# Patient Record
Sex: Female | Born: 1965 | Race: White | Hispanic: No | Marital: Married | Smoking: Current every day smoker
Health system: Southern US, Community
[De-identification: ages and names within clinical notes are randomized; demographics above are authoritative.]

## PROBLEM LIST (undated history)

## (undated) DIAGNOSIS — I10 Essential (primary) hypertension: Secondary | ICD-10-CM

## (undated) HISTORY — DX: Essential (primary) hypertension: I10

## (undated) HISTORY — PX: LAPAROSCOPIC CHOLECYSTECTOMY: SUR755

## (undated) HISTORY — PX: BREAST ENHANCEMENT SURGERY: SHX7

## (undated) HISTORY — PX: CHOLECYSTECTOMY: SHX55

## (undated) HISTORY — PX: MANDIBLE SURGERY: SHX707

## (undated) HISTORY — PX: HEMORRHOID SURGERY: SHX153

---

## 1998-09-25 ENCOUNTER — Other Ambulatory Visit: Admission: RE | Admit: 1998-09-25 | Discharge: 1998-09-25 | Payer: Self-pay | Admitting: Obstetrics & Gynecology

## 1999-11-11 ENCOUNTER — Other Ambulatory Visit: Admission: RE | Admit: 1999-11-11 | Discharge: 1999-11-11 | Payer: Self-pay | Admitting: Obstetrics & Gynecology

## 2000-04-04 ENCOUNTER — Ambulatory Visit (HOSPITAL_COMMUNITY): Admission: RE | Admit: 2000-04-04 | Discharge: 2000-04-04 | Payer: Self-pay | Admitting: Obstetrics & Gynecology

## 2001-01-19 ENCOUNTER — Other Ambulatory Visit: Admission: RE | Admit: 2001-01-19 | Discharge: 2001-01-19 | Payer: Self-pay | Admitting: Obstetrics & Gynecology

## 2002-02-08 ENCOUNTER — Other Ambulatory Visit: Admission: RE | Admit: 2002-02-08 | Discharge: 2002-02-08 | Payer: Self-pay | Admitting: Obstetrics & Gynecology

## 2003-02-28 ENCOUNTER — Other Ambulatory Visit: Admission: RE | Admit: 2003-02-28 | Discharge: 2003-02-28 | Payer: Self-pay | Admitting: Obstetrics & Gynecology

## 2003-09-03 ENCOUNTER — Other Ambulatory Visit: Admission: RE | Admit: 2003-09-03 | Discharge: 2003-09-03 | Payer: Self-pay | Admitting: Obstetrics & Gynecology

## 2004-02-19 ENCOUNTER — Encounter (INDEPENDENT_AMBULATORY_CARE_PROVIDER_SITE_OTHER): Payer: Self-pay | Admitting: Specialist

## 2004-02-19 ENCOUNTER — Ambulatory Visit (HOSPITAL_COMMUNITY): Admission: RE | Admit: 2004-02-19 | Discharge: 2004-02-19 | Payer: Self-pay | Admitting: General Surgery

## 2004-04-16 ENCOUNTER — Other Ambulatory Visit: Admission: RE | Admit: 2004-04-16 | Discharge: 2004-04-16 | Payer: Self-pay | Admitting: Obstetrics & Gynecology

## 2004-12-16 ENCOUNTER — Other Ambulatory Visit: Admission: RE | Admit: 2004-12-16 | Discharge: 2004-12-16 | Payer: Self-pay | Admitting: Obstetrics & Gynecology

## 2005-06-15 ENCOUNTER — Other Ambulatory Visit: Admission: RE | Admit: 2005-06-15 | Discharge: 2005-06-15 | Payer: Self-pay | Admitting: Obstetrics & Gynecology

## 2005-09-15 ENCOUNTER — Ambulatory Visit: Payer: Self-pay | Admitting: Cardiology

## 2005-09-15 ENCOUNTER — Ambulatory Visit: Payer: Self-pay | Admitting: Family Medicine

## 2006-07-10 ENCOUNTER — Ambulatory Visit: Payer: Self-pay | Admitting: Family Medicine

## 2007-01-29 ENCOUNTER — Ambulatory Visit: Payer: Self-pay | Admitting: Family Medicine

## 2007-01-29 DIAGNOSIS — J40 Bronchitis, not specified as acute or chronic: Secondary | ICD-10-CM | POA: Insufficient documentation

## 2007-02-09 ENCOUNTER — Telehealth: Payer: Self-pay | Admitting: Family Medicine

## 2007-11-15 ENCOUNTER — Ambulatory Visit: Payer: Self-pay | Admitting: Internal Medicine

## 2007-11-15 DIAGNOSIS — J309 Allergic rhinitis, unspecified: Secondary | ICD-10-CM | POA: Insufficient documentation

## 2007-11-15 DIAGNOSIS — J019 Acute sinusitis, unspecified: Secondary | ICD-10-CM

## 2008-05-23 ENCOUNTER — Ambulatory Visit: Payer: Self-pay | Admitting: Family Medicine

## 2008-05-23 DIAGNOSIS — A6000 Herpesviral infection of urogenital system, unspecified: Secondary | ICD-10-CM | POA: Insufficient documentation

## 2008-05-23 DIAGNOSIS — I10 Essential (primary) hypertension: Secondary | ICD-10-CM | POA: Insufficient documentation

## 2008-05-26 ENCOUNTER — Telehealth: Payer: Self-pay | Admitting: Family Medicine

## 2008-05-27 ENCOUNTER — Telehealth (INDEPENDENT_AMBULATORY_CARE_PROVIDER_SITE_OTHER): Payer: Self-pay | Admitting: *Deleted

## 2008-05-30 ENCOUNTER — Telehealth (INDEPENDENT_AMBULATORY_CARE_PROVIDER_SITE_OTHER): Payer: Self-pay | Admitting: *Deleted

## 2008-08-01 ENCOUNTER — Encounter: Admission: RE | Admit: 2008-08-01 | Discharge: 2008-08-01 | Payer: Self-pay | Admitting: Obstetrics & Gynecology

## 2008-10-13 ENCOUNTER — Ambulatory Visit: Payer: Self-pay | Admitting: Family Medicine

## 2008-10-13 LAB — CONVERTED CEMR LAB: Rapid Strep: NEGATIVE

## 2008-10-21 ENCOUNTER — Telehealth (INDEPENDENT_AMBULATORY_CARE_PROVIDER_SITE_OTHER): Payer: Self-pay | Admitting: *Deleted

## 2008-10-24 ENCOUNTER — Encounter: Payer: Self-pay | Admitting: Family Medicine

## 2010-02-18 ENCOUNTER — Emergency Department (HOSPITAL_COMMUNITY): Admission: EM | Admit: 2010-02-18 | Discharge: 2010-02-18 | Payer: Self-pay | Admitting: Family Medicine

## 2010-04-02 ENCOUNTER — Ambulatory Visit (HOSPITAL_COMMUNITY): Admission: RE | Admit: 2010-04-02 | Discharge: 2010-04-02 | Payer: Self-pay | Admitting: Oral and Maxillofacial Surgery

## 2010-08-06 ENCOUNTER — Other Ambulatory Visit: Payer: Self-pay | Admitting: Obstetrics & Gynecology

## 2010-11-05 NOTE — Op Note (Signed)
NAME:  Kimberly Carter, Kimberly Carter                             ACCOUNT NO.:  [id number]   MEDICAL RECORD NO.:  [id number]                   PATIENT TYPE:  AMB   LOCATION:  DAY                                  FACILITY:  [facility]   PHYSICIAN:  Timothy E. Earlene Plater, M.D.              DATE OF BIRTH:  [birth year]   DATE OF PROCEDURE:  02/19/2004  DATE OF DISCHARGE:                                 OPERATIVE REPORT   PREOPERATIVE DIAGNOSIS:  Internal and external hemorrhoids.   POSTOPERATIVE DIAGNOSIS:  Internal and external hemorrhoids with anal  polyps.   OPERATIVE PROCEDURE:  Examination under anesthesia, excision of anal polyps,  hemorrhoidectomy.   SURGEON:  Timothy E. Earlene Plater, M.D.   ANESTHESIA:  General.   [name] was in the office earlier this week and was quite desperate help  with her ballooning, prolapsing hemorrhoids that she has had off and on for  years, most severely in the last two weeks.  She is otherwise healthy, and  though she does not complain of her bowel actions, she is constipated,  frequently going many days without a stool.  She has been carefully  counseled in regards to the pathology, the GI function, and bowel habits,  the hemorrhoidal surgery, the expectations and possible complications,  including sphincter injury, anal stenosis, bleeding, infection, and  recurrent hemorrhoids, and she still agrees.  She was identified and the  permit signed.   Patient was taken to the operating room and placed supine.  LMA anesthesia  provided.  She was placed in lithotomy.  The perianal area was inspected,  prepped and draped in the usual fashion.  Soft, fragile, ballooning  hemorrhoids were present left lateral and right posterior.  As well, there  was a polyp on top of the left lateral hemorrhoid and a second polyp in the  posterior dentate line.  The anterior anorectum was normal.  The area was  injected round about with Marcaine, epinephrine, and Wydase and massaged in  well.  The  perianal area was handled delicately, as her skin was thin and  the blood vessels fragile.  Appropriate incisions were made, both left  lateral and right posterior.  Careful undermining of the skin revealed all  of the underlying varicosities, which were removed.  Each wound was closed  carefully with a running 3-0 chromic.  The anal polyp posteriorly was then  simply excised, and its base over-sewn with a figure-of-eight suture of 3-0  chromic.  She tolerated this well.  There was no bleeding or complication.  The anal orifice was adequate.  The sphincter was not harmed.  Gelfoam gauze  and a dry sterile dressing applied.  Counts were correct.  She was awakened  and taken to the recovery room in good condition.   Written and verbal instructions were given, including Demerol 50 mg #36, and  she will be seen and followed as an  outpatient.                                               Timothy E. Earlene Plater, M.D.    TED/MEDQ  D:  02/19/2004  T:  02/19/2004  Job:  [id number]   cc:   Angelena Sole, M.D. [facility]

## 2010-11-05 NOTE — Procedures (Signed)
[facility]  Patient:    EVERLYN, FARABAUGH                      MRN: [id number] Proc. Date: 04/04/00 Adm. Date:  [id number] Attending:  Judeth Cornfield CC:         Angelena Sole, M.D. [facility]   Procedure Report  PROCEDURE:  Sigmoidoscopy.  HISTORY:  Mrs. Brooke Dare has had recurrent symptomatic hemorrhoids.  Test is performed for evaluation and band ligation.  INFORMED CONSENT:  The patient provided consent after risks, benefits, and alternatives were explained.  MEDICATIONS:  Versed 2 mg IV, Fentanyl 50 mcg IV.  DESCRIPTION OF PROCEDURE:  The patient was placed in the left lateral decubitus position, administered continuous low-flow oxygen, and was placed on pulse oximetry.  Digital inspection of the rectum demonstrated a grade 3, partially thrombosed hemorrhoid.  The Olympus video colonoscope was inserted to 30 cm corresponding to the sigmoid.  The scope was then withdrawn, and the distal colon was examined. The scope was also retroflexed in the rectal vault.  FINDINGS:  There was a partially thrombosed hemorrhoid as previously described.  There were multiple large internal hemorrhoids as well.  IMPRESSION:  Internal hemorrhoids and partially thrombosed hemorrhoid.  RECOMMENDATIONS: 1. Because of the acuteness of the problems including the partially thrombosed    hemorrhoid, I decided not to pursue band ligation. 2. Warm soaks, Anusol-HC suppositories. 3. Surgical evaluation. DD:  04/04/00 TD:  04/04/00 Job: [id number]

## 2010-11-22 ENCOUNTER — Ambulatory Visit (INDEPENDENT_AMBULATORY_CARE_PROVIDER_SITE_OTHER): Payer: PRIVATE HEALTH INSURANCE | Admitting: Family Medicine

## 2010-11-22 ENCOUNTER — Encounter: Payer: Self-pay | Admitting: Family Medicine

## 2010-11-22 VITALS — BP 120/74 | Temp 99.1°F | Wt 152.0 lb

## 2010-11-22 DIAGNOSIS — J029 Acute pharyngitis, unspecified: Secondary | ICD-10-CM

## 2010-11-22 DIAGNOSIS — I889 Nonspecific lymphadenitis, unspecified: Secondary | ICD-10-CM

## 2010-11-22 LAB — POCT RAPID STREP A (OFFICE): Rapid Strep A Screen: NEGATIVE

## 2010-11-22 MED ORDER — CLARITHROMYCIN ER 500 MG PO TB24: 1000.0000 mg | ORAL_TABLET | Freq: Every day | ORAL | Status: AC

## 2010-11-22 NOTE — Patient Instructions (Signed)
Strep test is negative- this is good news Take the Biaxin as directed for possible infected lymph node Continue tylenol/ibuprofen as needed for pain Drink plenty of fluids Call with any questions or concerns Hang in there!

## 2010-11-22 NOTE — Assessment & Plan Note (Signed)
Rapid strep was negative.  Most likely viral.  Tylenol/ibuprofen as needed for pain.

## 2010-11-22 NOTE — Progress Notes (Signed)
  Subjective:    Patient ID: Kimberly Carter, female    DOB: 1966-02-26, 45 y.o.   MRN: [id number]  HPI L ear pain- sxs started 10 days ago w/ sore throat.  Pain improves w/ ibuprofen.  Denies facial pain/pressure.  No fever.  No known sick contacts.  + PND w/ cough.  + TTP over L side of throat.   Review of Systems For ROS see HPI     Objective:   Physical Exam  Constitutional: She appears well-developed and well-nourished. No distress.  HENT:  Head: Normocephalic and atraumatic.  Nose: Nose normal.  Mouth/Throat: No oropharyngeal exudate.       No TTP over sinuses TMs normal bilaterally Throat w/out erythema or tonsilar enlargement/exudates + PND  Neck: Normal range of motion. Neck supple.  Cardiovascular: Normal rate, regular rhythm, normal heart sounds and intact distal pulses.   Pulmonary/Chest: Effort normal and breath sounds normal. No respiratory distress. She has no wheezes.  Lymphadenopathy:    She has cervical adenopathy (tender mid-anterior L cervical chain LAD (~1.5 cm)).          Assessment & Plan:

## 2010-11-22 NOTE — Assessment & Plan Note (Signed)
Pt w/ tender, likely reactive L ant chain cervical LAD.  Start biaxin to tx lymphadenitis as pt is allergic to augmentin.  Reviewed supportive care and red flags that should prompt return.  Pt expressed understanding and is in agreement w/ plan.

## 2010-11-29 ENCOUNTER — Telehealth: Payer: Self-pay | Admitting: *Deleted

## 2010-11-29 ENCOUNTER — Ambulatory Visit (INDEPENDENT_AMBULATORY_CARE_PROVIDER_SITE_OTHER): Payer: PRIVATE HEALTH INSURANCE | Admitting: Family Medicine

## 2010-11-29 ENCOUNTER — Encounter: Payer: Self-pay | Admitting: Family Medicine

## 2010-11-29 VITALS — BP 114/74 | HR 64 | Temp 98.9°F | Wt 153.0 lb

## 2010-11-29 DIAGNOSIS — J029 Acute pharyngitis, unspecified: Secondary | ICD-10-CM

## 2010-11-29 NOTE — Progress Notes (Signed)
  Subjective:     Kimberly Carter is a 45 y.o. female who presents for evaluation of sore throat. Associated symptoms include nasal blockage, post nasal drip, sinus and nasal congestion and sore throat. Onset of symptoms was 10 days ago, and have been unchanged since that time. She is drinking plenty of fluids. She has not had a recent close exposure to someone with proven streptococcal pharyngitis.  The following portions of the patient's history were reviewed and updated as appropriate: allergies, current medications, past family history, past medical history, past social history, past surgical history and problem list.  Review of Systems Pertinent items are noted in HPI.    Objective:    BP 114/74  Pulse 64  Temp(Src) 98.9 F (37.2 C) (Oral)  Wt 153 lb (69.4 kg)  SpO2 96% General appearance: alert, cooperative, appears stated age and no distress Ears: normal TM's and external ear canals both ears Nose: Nares normal. Septum midline. Mucosa normal. No drainage or sinus tenderness., clear discharge, mild congestion Throat: abnormal findings: mild oropharyngeal erythema Neck: no adenopathy, no carotid bruit, no JVD, supple, symmetrical, trachea midline and thyroid not enlarged, symmetric, no tenderness/mass/nodules Lungs: clear to auscultation bilaterally Heart: regular rate and rhythm, S1, S2 normal, no murmur, click, rub or gallop  Laboratory Strep test done. Results:negative.    Assessment:    Acute pharyngitis, likely  Viral pharyngitis.    Plan:    Use of OTC analgesics recommended as well as salt water gargles. Follow up as needed. antihistamine and veramyst, astepro

## 2010-11-29 NOTE — Patient Instructions (Signed)
Take otc antihistamine---ex  claritin , zyrtec or allegra daily Use veramyst and astepro---they are both 2 sprays each nostril 1x a day We should have the culture back in 2-3 days

## 2010-11-29 NOTE — Telephone Encounter (Signed)
Pt was given 10 days of biaxin- if she is not feeling any better will need to return for additional evaluation

## 2010-11-29 NOTE — Telephone Encounter (Signed)
Pt called noting that she was seen seven days ago and is not any better. Pt would like to know if there is something else she can try or should she come back in. Please advise.

## 2010-11-29 NOTE — Telephone Encounter (Signed)
Pt.notified

## 2010-11-30 NOTE — Progress Notes (Signed)
Addended by: Legrand Como on: 11/30/2010 11:26 AM   Modules accepted: Orders

## 2010-12-03 NOTE — Progress Notes (Signed)
Addended by: Legrand Como on: 12/03/2010 08:08 AM   Modules accepted: Orders

## 2010-12-06 ENCOUNTER — Other Ambulatory Visit: Payer: Self-pay | Admitting: Family Medicine

## 2010-12-06 ENCOUNTER — Other Ambulatory Visit: Payer: PRIVATE HEALTH INSURANCE

## 2010-12-07 ENCOUNTER — Other Ambulatory Visit: Payer: Self-pay | Admitting: Family Medicine

## 2010-12-07 ENCOUNTER — Other Ambulatory Visit: Payer: PRIVATE HEALTH INSURANCE

## 2010-12-07 LAB — CBC WITH DIFFERENTIAL/PLATELET
Basophils Absolute: 0 10*3/uL (ref 0.0–0.1)
Eosinophils Absolute: 0.6 10*3/uL (ref 0.0–0.7)
Eosinophils Relative: 9 % — ABNORMAL HIGH (ref 0–5)
HCT: 39.9 % (ref 36.0–46.0)
MCH: 35.5 pg — ABNORMAL HIGH (ref 26.0–34.0)
MCV: 105.8 fL — ABNORMAL HIGH (ref 78.0–100.0)
Monocytes Absolute: 0.4 10*3/uL (ref 0.1–1.0)
Platelets: 252 10*3/uL (ref 150–400)
RDW: 13.9 % (ref 11.5–15.5)

## 2010-12-07 NOTE — Progress Notes (Signed)
Addended by: Legrand Como on: 12/07/2010 05:01 PM   Modules accepted: Orders

## 2010-12-07 NOTE — Progress Notes (Signed)
Labs only

## 2010-12-08 ENCOUNTER — Encounter: Payer: Self-pay | Admitting: *Deleted

## 2010-12-08 LAB — EPSTEIN-BARR VIRUS VCA ANTIBODY PANEL
EBV EA IgG: 4.15 {ISR} — ABNORMAL HIGH
EBV NA IgG: 2.51 {ISR} — ABNORMAL HIGH
EBV VCA IgG: 3.71 {ISR} — ABNORMAL HIGH
EBV VCA IgM: 0.37 {ISR}

## 2010-12-09 ENCOUNTER — Telehealth: Payer: Self-pay | Admitting: *Deleted

## 2010-12-09 NOTE — Telephone Encounter (Signed)
Pt states that she is currently taking 2 nasal spray to help with her allergy. Pt notes that she continues to  Have a sore throat from drainage but she has not started antihistamine as of yet. Pt indicated that she is going to start antihistamine along with nasal spray to see if this will help and if not will call back for further suggestions.

## 2010-12-09 NOTE — Telephone Encounter (Signed)
Message copied by Verdene Rio on Thu Dec 09, 2010  4:34 PM ------      Message from: Lelon Perla      Created: Wed Dec 08, 2010  8:54 AM       CBC normal except eosin---prob from allergies----recheck 3 months  cbcd  288.8

## 2010-12-11 LAB — RSV(RESPIRATORY SYNCYTIAL VIRUS) AB, BLOOD

## 2011-03-02 ENCOUNTER — Ambulatory Visit (HOSPITAL_BASED_OUTPATIENT_CLINIC_OR_DEPARTMENT_OTHER)
Admission: RE | Admit: 2011-03-02 | Discharge: 2011-03-02 | Disposition: A | Discharge status: Home / Self Care | Payer: PRIVATE HEALTH INSURANCE | Source: Ambulatory Visit | Attending: Family | Admitting: Family

## 2011-03-02 ENCOUNTER — Telehealth: Payer: Self-pay | Admitting: Family

## 2011-03-02 ENCOUNTER — Encounter: Payer: Self-pay | Admitting: Family

## 2011-03-02 ENCOUNTER — Ambulatory Visit: Payer: PRIVATE HEALTH INSURANCE | Admitting: Family Medicine

## 2011-03-02 ENCOUNTER — Ambulatory Visit (INDEPENDENT_AMBULATORY_CARE_PROVIDER_SITE_OTHER): Payer: PRIVATE HEALTH INSURANCE | Admitting: Family

## 2011-03-02 VITALS — BP 98/70 | HR 84 | Temp 97.8°F | Resp 16 | Wt 149.0 lb

## 2011-03-02 DIAGNOSIS — R109 Unspecified abdominal pain: Secondary | ICD-10-CM | POA: Insufficient documentation

## 2011-03-02 DIAGNOSIS — K7689 Other specified diseases of liver: Secondary | ICD-10-CM

## 2011-03-02 DIAGNOSIS — K769 Liver disease, unspecified: Secondary | ICD-10-CM

## 2011-03-02 DIAGNOSIS — M545 Low back pain, unspecified: Secondary | ICD-10-CM | POA: Insufficient documentation

## 2011-03-02 DIAGNOSIS — N200 Calculus of kidney: Secondary | ICD-10-CM | POA: Insufficient documentation

## 2011-03-02 DIAGNOSIS — Z975 Presence of (intrauterine) contraceptive device: Secondary | ICD-10-CM | POA: Insufficient documentation

## 2011-03-02 DIAGNOSIS — R3129 Other microscopic hematuria: Secondary | ICD-10-CM

## 2011-03-02 DIAGNOSIS — N390 Urinary tract infection, site not specified: Secondary | ICD-10-CM

## 2011-03-02 LAB — POCT URINALYSIS DIPSTICK
Bilirubin, UA: NEGATIVE
Glucose, UA: NEGATIVE
Ketones, UA: NEGATIVE
Leukocytes, UA: NEGATIVE
Nitrite, UA: NEGATIVE
pH, UA: 8

## 2011-03-02 MED ORDER — CIPROFLOXACIN HCL 500 MG PO TABS: 500.0000 mg | ORAL_TABLET | Freq: Two times a day (BID) | ORAL | Status: AC

## 2011-03-02 MED ORDER — MELOXICAM 7.5 MG PO TABS: ORAL_TABLET | ORAL | Status: DC

## 2011-03-02 NOTE — Telephone Encounter (Signed)
Notified [employer] at [employer].

## 2011-03-02 NOTE — Telephone Encounter (Signed)
Spoke with pt.  Reviewed, CT results, plan for mri and PRN mobic. Pt verbalizes understanding.

## 2011-03-02 NOTE — Telephone Encounter (Signed)
Please advise how often pt should take Meloxicam?

## 2011-03-02 NOTE — Progress Notes (Signed)
  Subjective:    Patient ID: Kimberly Carter, female    DOB: 06-18-1966, 45 y.o.   MRN: [id number]  HPI  Ms.  Carter is a 45 yr old patient who presents today with chief complaint of left lower back pain.  Symptoms started on Monday 9/10. She also notes associated suprapubic pain.  Denies associated urinary frequency, fever, dysuria, or hematuria.  Denies previous hx of kidney stones or known back injury.   Review of Systems See HPI  Past Medical History  Diagnosis Date  . Hypertension   . Genital herpes   . Allergic rhinitis     History   Social History  . Marital Status: Married    Spouse Name: N/A    Number of Children: N/A  . Years of Education: N/A   Occupational History  . Not on file.   Social History Main Topics  . Smoking status: Current Everyday Smoker  . Smokeless tobacco: Not on file   Comment: passive smoker  . Alcohol Use: Yes     occ.  . Drug Use: No  . Sexually Active:    Other Topics Concern  . Not on file   Social History Narrative  . No narrative on file    No past surgical history on file.  No family history on file.  Allergies  Allergen Reactions  . Codeine   . NWG:NFAOZHYQMVH+QIONGEXBM+WUXLKGMWNU Acid+Aspartame     REACTION: diarrhea    Current Outpatient Prescriptions on File Prior to Visit  Medication Sig Dispense Refill  . atenolol (TENORMIN) 100 MG tablet Take 100 mg by mouth daily.        . sertraline (ZOLOFT) 25 MG tablet Take 25 mg by mouth as directed. One half tab qhs       . valACYclovir (VALTREX) 1000 MG tablet Take 500 mg by mouth daily.          BP 98/70  Pulse 84  Temp(Src) 97.8 F (36.6 C) (Oral)  Resp 16  Wt 149 lb (67.586 kg)       Objective:   Physical Exam  Constitutional: She appears well-developed and well-nourished. No distress.  Cardiovascular: Normal rate and regular rhythm.   No murmur heard. Pulmonary/Chest: Effort normal and breath sounds normal. No respiratory distress. She has no wheezes. She  has no rales.  Abdominal: Soft. Bowel sounds are normal. There is no hepatosplenomegaly.       Mild suprapubic tenderness to palpation without guarding.    Genitourinary:       Neg CVAT bilaterally          Assessment & Plan:

## 2011-03-02 NOTE — Patient Instructions (Signed)
Please go downstairs this AM to complete your CT scan. We will contact you with the results. Start Cipro today. Call if you see bright red blood in urine, if you develop fever >101, nausea, worsening low back pain- or if you are not feeling better in 2-3 days.

## 2011-03-02 NOTE — Telephone Encounter (Signed)
Per [facility], they need directions for meloxicam 7.5mg  tablet

## 2011-03-02 NOTE — Telephone Encounter (Signed)
Clarification- once daily PRN pain, thanks

## 2011-03-03 ENCOUNTER — Telehealth: Payer: Self-pay | Admitting: *Deleted

## 2011-03-03 DIAGNOSIS — K769 Liver disease, unspecified: Secondary | ICD-10-CM | POA: Insufficient documentation

## 2011-03-03 DIAGNOSIS — N390 Urinary tract infection, site not specified: Secondary | ICD-10-CM | POA: Insufficient documentation

## 2011-03-03 DIAGNOSIS — Z0189 Encounter for other specified special examinations: Secondary | ICD-10-CM

## 2011-03-03 LAB — CREATININE, SERUM: Creat: 0.86 mg/dL (ref 0.50–1.10)

## 2011-03-03 LAB — BUN: BUN: 12 mg/dL (ref 6–23)

## 2011-03-03 NOTE — Assessment & Plan Note (Signed)
Incidental finding on CT of 3.6 x 3.3 cm hypodense lesion in the posterior segment right hepatic lobe. Three additional tiny subcentimeter hypoenhancing hepatic lesions are suspected.  Will refer for MRI per radiology recommendations for further evaluation.

## 2011-03-03 NOTE — Assessment & Plan Note (Signed)
UA + for blood.  Will plan to send for culture and treat empirically with cipro.  CT of the abdomen was performed to rule out stone in the setting of back pain and microscopic hematuria.  Note was made of small non-obstructing stones on contralateral side to pt's pain.  Pain likely MS in origin.

## 2011-03-03 NOTE — Telephone Encounter (Signed)
Pt will return for MRI with contrast and pt needs BUN/Creat prior to receiving contrast dye. Pt will return to the lab today for tests. Order has been entered and faxed to the lab.

## 2011-03-04 ENCOUNTER — Telehealth: Payer: Self-pay | Admitting: Family

## 2011-03-04 LAB — URINE CULTURE: Organism ID, Bacteria: NO GROWTH

## 2011-03-04 NOTE — Telephone Encounter (Signed)
Patient returned call. She stated that her IUD is a [employer] fast streamer. Best# to call [phone]

## 2011-03-04 NOTE — Telephone Encounter (Signed)
Confirmed with pt.

## 2011-03-04 NOTE — Telephone Encounter (Signed)
Left message for pt to return our call. When she calls back pls ask her if her IUD is a Conservation officer, nature (copper). I need to know this prior to her MRI tomorrow.

## 2011-03-05 ENCOUNTER — Ambulatory Visit (HOSPITAL_BASED_OUTPATIENT_CLINIC_OR_DEPARTMENT_OTHER)
Admission: RE | Admit: 2011-03-05 | Discharge: 2011-03-05 | Disposition: A | Discharge status: Home / Self Care | Payer: PRIVATE HEALTH INSURANCE | Source: Ambulatory Visit | Attending: Family | Admitting: Family

## 2011-03-05 DIAGNOSIS — K769 Liver disease, unspecified: Secondary | ICD-10-CM

## 2011-03-05 DIAGNOSIS — Q619 Cystic kidney disease, unspecified: Secondary | ICD-10-CM

## 2011-03-05 DIAGNOSIS — K7689 Other specified diseases of liver: Secondary | ICD-10-CM

## 2011-03-05 MED ORDER — GADOBENATE DIMEGLUMINE 529 MG/ML IV SOLN
15.0000 mL | Freq: Once | INTRAVENOUS | Status: AC | PRN
  Administered 2011-03-05: 15 mL via INTRAVENOUS

## 2011-03-07 ENCOUNTER — Telehealth: Payer: Self-pay | Admitting: Family

## 2011-03-07 NOTE — Telephone Encounter (Signed)
Left message for pt to return my call to discuss MRI.

## 2011-03-07 NOTE — Telephone Encounter (Signed)
Spoke with pt, reviewed results and plan to repeat her MRI in 3-6 months.  Pt verbalizes understanding.

## 2011-06-20 ENCOUNTER — Telehealth: Payer: Self-pay | Admitting: Family

## 2011-06-20 DIAGNOSIS — K769 Liver disease, unspecified: Secondary | ICD-10-CM

## 2011-06-26 NOTE — Telephone Encounter (Signed)
Kimberly Carter- Please call pt and let her know that I have ordered her follow up MRI to re-evaluate the spot on her liver that was seen on CT in September.  She should have a BMET drawn 2 days before MRI please.  That can be drawn at [facility].  MRI order has been pended.

## 2011-06-27 NOTE — Telephone Encounter (Signed)
Left message on machine to return my call. 

## 2011-06-28 NOTE — Telephone Encounter (Signed)
Left message on machine to return my call. 

## 2011-06-30 NOTE — Telephone Encounter (Signed)
Left message on cell voicemail to return my call 

## 2011-07-01 NOTE — Telephone Encounter (Signed)
Attempted to reach and pt and received voicemail. Mailed contact letter as pt has not returned my previous calls.

## 2011-07-05 LAB — BASIC METABOLIC PANEL
BUN: 16 mg/dL (ref 6–23)
Calcium: 8.9 mg/dL (ref 8.4–10.5)
Chloride: 101 mEq/L (ref 96–112)
Creat: 0.72 mg/dL (ref 0.50–1.10)

## 2011-07-05 NOTE — Telephone Encounter (Signed)
Addended by: Mervin Kung A on: 07/05/2011 01:45 PM   Modules accepted: Orders

## 2011-07-05 NOTE — Telephone Encounter (Signed)
Kimberly Carter spoke with pt, she will come to our office today for lab work. Order entered and forwarded to the lab.

## 2011-07-09 ENCOUNTER — Other Ambulatory Visit (HOSPITAL_BASED_OUTPATIENT_CLINIC_OR_DEPARTMENT_OTHER): Payer: PRIVATE HEALTH INSURANCE

## 2011-07-09 ENCOUNTER — Ambulatory Visit (HOSPITAL_BASED_OUTPATIENT_CLINIC_OR_DEPARTMENT_OTHER)
Admission: RE | Admit: 2011-07-09 | Discharge: 2011-07-09 | Disposition: A | Discharge status: Home / Self Care | Payer: 59 | Source: Ambulatory Visit | Attending: Family | Admitting: Family

## 2011-07-09 DIAGNOSIS — K769 Liver disease, unspecified: Secondary | ICD-10-CM

## 2011-07-09 DIAGNOSIS — N289 Disorder of kidney and ureter, unspecified: Secondary | ICD-10-CM | POA: Insufficient documentation

## 2011-07-09 DIAGNOSIS — Z0389 Encounter for observation for other suspected diseases and conditions ruled out: Secondary | ICD-10-CM

## 2011-07-09 DIAGNOSIS — K7689 Other specified diseases of liver: Secondary | ICD-10-CM | POA: Insufficient documentation

## 2011-07-09 MED ORDER — GADOBENATE DIMEGLUMINE 529 MG/ML IV SOLN
10.0000 mL | Freq: Once | INTRAVENOUS | Status: AC | PRN
  Administered 2011-07-09: 10 mL via INTRAVENOUS

## 2011-07-11 ENCOUNTER — Telehealth: Payer: Self-pay | Admitting: Family

## 2011-07-11 NOTE — Telephone Encounter (Signed)
Reviewed MRI report with pt.  Reassured her that changes appear benign.

## 2011-09-28 ENCOUNTER — Other Ambulatory Visit: Payer: Self-pay | Admitting: Obstetrics & Gynecology

## 2011-09-28 DIAGNOSIS — R928 Other abnormal and inconclusive findings on diagnostic imaging of breast: Secondary | ICD-10-CM

## 2011-10-04 ENCOUNTER — Ambulatory Visit
Admission: RE | Admit: 2011-10-04 | Discharge: 2011-10-04 | Disposition: A | Discharge status: Home / Self Care | Payer: 59 | Source: Ambulatory Visit | Attending: Obstetrics & Gynecology | Admitting: Obstetrics & Gynecology

## 2011-10-04 DIAGNOSIS — R928 Other abnormal and inconclusive findings on diagnostic imaging of breast: Secondary | ICD-10-CM

## 2012-01-17 ENCOUNTER — Encounter (HOSPITAL_COMMUNITY): Payer: Self-pay | Admitting: Emergency Medicine

## 2012-01-17 ENCOUNTER — Emergency Department (HOSPITAL_COMMUNITY): Payer: 59

## 2012-01-17 ENCOUNTER — Emergency Department (HOSPITAL_COMMUNITY)
Admission: EM | Admit: 2012-01-17 | Discharge: 2012-01-17 | Disposition: A | Discharge status: Home / Self Care | Payer: 59 | Attending: Emergency Medicine | Admitting: Emergency Medicine

## 2012-01-17 DIAGNOSIS — I10 Essential (primary) hypertension: Secondary | ICD-10-CM | POA: Insufficient documentation

## 2012-01-17 DIAGNOSIS — R0602 Shortness of breath: Secondary | ICD-10-CM | POA: Insufficient documentation

## 2012-01-17 DIAGNOSIS — M549 Dorsalgia, unspecified: Secondary | ICD-10-CM

## 2012-01-17 DIAGNOSIS — Z79899 Other long term (current) drug therapy: Secondary | ICD-10-CM | POA: Insufficient documentation

## 2012-01-17 DIAGNOSIS — F17200 Nicotine dependence, unspecified, uncomplicated: Secondary | ICD-10-CM | POA: Insufficient documentation

## 2012-01-17 DIAGNOSIS — M48061 Spinal stenosis, lumbar region without neurogenic claudication: Secondary | ICD-10-CM | POA: Insufficient documentation

## 2012-01-17 LAB — CBC WITH DIFFERENTIAL/PLATELET
Basophils Absolute: 0 10*3/uL (ref 0.0–0.1)
Basophils Relative: 0 % (ref 0–1)
Eosinophils Absolute: 0 10*3/uL (ref 0.0–0.7)
Eosinophils Relative: 0 % (ref 0–5)
HCT: 36.7 % (ref 36.0–46.0)
MCH: 35.1 pg — ABNORMAL HIGH (ref 26.0–34.0)
MCHC: 34.3 g/dL (ref 30.0–36.0)
MCV: 102.2 fL — ABNORMAL HIGH (ref 78.0–100.0)
Monocytes Absolute: 0.6 10*3/uL (ref 0.1–1.0)
RDW: 13.1 % (ref 11.5–15.5)

## 2012-01-17 LAB — URINALYSIS, ROUTINE W REFLEX MICROSCOPIC
Glucose, UA: NEGATIVE mg/dL
Ketones, ur: NEGATIVE mg/dL
Leukocytes, UA: NEGATIVE
Protein, ur: NEGATIVE mg/dL

## 2012-01-17 LAB — BASIC METABOLIC PANEL
Calcium: 8.4 mg/dL (ref 8.4–10.5)
Creatinine, Ser: 0.67 mg/dL (ref 0.50–1.10)
GFR calc Af Amer: >90 mL/min (ref >90)

## 2012-01-17 LAB — LACTIC ACID, PLASMA: Lactic Acid, Venous: 1.3 mmol/L (ref 0.5–2.2)

## 2012-01-17 MED ORDER — ONDANSETRON HCL 4 MG/2ML IJ SOLN
4.0000 mg | INTRAMUSCULAR | Status: AC
  Administered 2012-01-17: 4 mg via INTRAVENOUS
  Filled 2012-01-17: qty 2

## 2012-01-17 MED ORDER — HYDROMORPHONE HCL PF 2 MG/ML IJ SOLN
2.0000 mg | Freq: Once | INTRAMUSCULAR | Status: AC
  Administered 2012-01-17: 2 mg via INTRAVENOUS
  Filled 2012-01-17: qty 1

## 2012-01-17 MED ORDER — ONDANSETRON 4 MG PO TBDP: 4.0000 mg | ORAL_TABLET | Freq: Three times a day (TID) | ORAL | Status: AC | PRN

## 2012-01-17 MED ORDER — MORPHINE SULFATE 4 MG/ML IJ SOLN
4.0000 mg | INTRAMUSCULAR | Status: DC | PRN
  Administered 2012-01-17: 4 mg via INTRAVENOUS
  Filled 2012-01-17 (×2): qty 1

## 2012-01-17 MED ORDER — HYDROCODONE-ACETAMINOPHEN 5-500 MG PO TABS: 1.0000 | ORAL_TABLET | Freq: Four times a day (QID) | ORAL | Status: AC | PRN

## 2012-01-17 MED ORDER — SENNOSIDES-DOCUSATE SODIUM 8.6-50 MG PO TABS: 1.0000 | ORAL_TABLET | Freq: Every day | ORAL | Status: AC

## 2012-01-17 MED ORDER — MORPHINE SULFATE 4 MG/ML IJ SOLN
4.0000 mg | Freq: Once | INTRAMUSCULAR | Status: AC
  Administered 2012-01-17: 4 mg via INTRAVENOUS
  Filled 2012-01-17: qty 1

## 2012-01-17 MED ORDER — GADOBENATE DIMEGLUMINE 529 MG/ML IV SOLN
13.0000 mL | Freq: Once | INTRAVENOUS | Status: AC | PRN
  Administered 2012-01-17: 13 mL via INTRAVENOUS

## 2012-01-17 NOTE — ED Notes (Signed)
Patient transported to MRI 

## 2012-01-17 NOTE — ED Notes (Signed)
Pt presents with c/o low back pain   Pt states she has problems with her back and has been seeing an orthopeadic dr  Pt states she went to him yesterday and was given 2 different medications that are not helping  Pt states she is scheduled for an MRI on Friday at 715  Pt states the pain feels different today causing her difficulty walking

## 2012-01-17 NOTE — ED Notes (Signed)
Bear Hugger applied due to low temp, PA aware.

## 2012-01-17 NOTE — ED Notes (Signed)
Patient transported to X-ray 

## 2012-01-17 NOTE — ED Provider Notes (Signed)
History     CSN: [id number]  Arrival date & time 01/17/12  0708   First MD Initiated Contact with Patient 01/17/12 [phone]      Chief Complaint  Patient presents with  . Back Pain    (Consider location/radiation/quality/duration/timing/severity/associated sxs/prior treatment) HPI  46 y/o female INAD excaerbation of chronic low back pain x2 days. Denies fever, change in bowel/or bladder habits, or parsathesia/numbness, h/o cancer or IVDU. Pt had her granddaughter over for the weekend and was very active and lifting her. Pt saw her orthopedist  Dr. Yevette Edwards yesterday, was given methocarbamol and meloxicam, which are not helping with pain. 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 Pt reports difficulty ambulating secondary to pain. Pain is positional 8/10 while staying still and 10/10 and while moving or weight bearing. Pt has MRI scheduled for Friday and would like that expedited today.   Past Medical History  Diagnosis Date  . Hypertension   . Genital herpes   . Allergic rhinitis     Past Surgical History  Procedure Date  . Mandible surgery   . Breast enhancement surgery     Family History  Problem Relation Age of Onset  . Hypertension Other   . Diabetes Other     History  Substance Use Topics  . Smoking status: Current Everyday Smoker  . Smokeless tobacco: Not on file   Comment: passive smoker  . Alcohol Use: Yes     occ.    OB History    Grav Para Term Preterm Abortions TAB SAB Ect Mult Living                  Review of Systems  Constitutional: Negative for fever.  Musculoskeletal: Positive for back pain.  Neurological: Negative for weakness.  All other systems reviewed and are negative.    Allergies  Amoxicillin-pot clavulanate and Codeine  Home Medications   Current Outpatient Rx  Name Route Sig Dispense Refill  . MELOXICAM 15 MG PO TABS Oral Take 15 mg by mouth daily.    Marland Kitchen METHOCARBAMOL 500 MG PO TABS Oral Take 500 mg by  mouth as needed.    . ATENOLOL 100 MG PO TABS Oral Take 100 mg by mouth daily.      Marland Kitchen CETIRIZINE HCL 10 MG PO TABS Oral Take 10 mg by mouth daily.      . SERTRALINE HCL 25 MG PO TABS Oral Take 25 mg by mouth as directed. One half tab qhs     . TRIAMTERENE-HCTZ 37.5-25 MG PO TABS Oral Take 1 tablet by mouth daily as needed. fluid    . VALACYCLOVIR HCL 1 G PO TABS Oral Take 500 mg by mouth daily.        BP 110/68  Pulse 65  Temp 97.7 F (36.5 C) (Oral)  Resp 18  SpO2 100%  Physical Exam  Nursing note and vitals reviewed. Constitutional: She is oriented to person, place, and time. She appears well-developed and well-nourished. No distress.  HENT:  Head: Normocephalic and atraumatic.  Eyes: Conjunctivae and EOM are normal. Pupils are equal, round, and reactive to light.  Neck: Normal range of motion.  Cardiovascular: Normal rate.   Pulmonary/Chest: Effort normal and breath sounds normal.  Abdominal: Soft. Bowel sounds are normal.  Musculoskeletal: Normal range of motion. She exhibits no edema.       Strait leg raise negative bilaterally. Sensation intact to light touch to lower extremities and her nail area. Diffusely  tender to lumbar paraspinal muscles.  Neurological: She is alert and oriented to person, place, and time.       Cranial nerves III through XII intact. Strength 5 out of 5x4 extremities.  Psychiatric: She has a normal mood and affect.    ED Course  Procedures (including critical care time)  Labs Reviewed  URINALYSIS, ROUTINE W REFLEX MICROSCOPIC - Abnormal; Notable for the following:    APPearance CLOUDY (*)     All other components within normal limits  CBC WITH DIFFERENTIAL - Abnormal; Notable for the following:    RBC 3.59 (*)     MCV 102.2 (*)     MCH 35.1 (*)     All other components within normal limits  BASIC METABOLIC PANEL - Abnormal; Notable for the following:    Glucose, Bld 108 (*)     All other components within normal limits  LACTIC ACID, PLASMA    CULTURE, BLOOD (ROUTINE X 2)  CULTURE, BLOOD (ROUTINE X 2)   Dg Chest 2 View  01/17/2012  *RADIOLOGY REPORT*  Clinical Data: Shortness of breath and weakness.  Smoker. Controlled hypertension.  CHEST - 2 VIEW  Comparison: None.  Findings: Normal sized heart.  Clear lungs.  Bilateral breast prostheses.  Unremarkable bones.  IMPRESSION: No acute abnormality.  Original Report Authenticated By: Darrol Angel, M.D.   Mr Lumbar Spine W Wo Contrast  01/17/2012  **ADDENDUM** CREATED: 01/17/2012 14:48:59  3.  Negative for epidural abscess.  **END ADDENDUM** SIGNED BY: Andreas Newport, M.D.   01/17/2012  *RADIOLOGY REPORT*  Clinical Data: 46 year old woman with acute on chronic low back pain. Epidural abscess.  MRI LUMBAR SPINE WITHOUT AND WITH CONTRAST  Technique:  Multiplanar and multiecho pulse sequences of the lumbar spine were obtained without and with intravenous contrast.  Contrast: 13mL MULTIHANCE GADOBENATE DIMEGLUMINE 529 MG/ML IV SOLN  Comparison: CT abdomen 03/02/2011.  Findings: Five lumbar type vertebral bodies are identified. Vertebral body height is preserved.  Active red marrow is identified.  Spinal cord terminates posterior the L1 vertebra. Paraspinal soft tissues demonstrate an atrophic/dystrophic right kidney with cystic lesions which appears similar to the prior CT 03/02/2011.  There is a mild levoconvex curvature of the lumbar spine with the apex at L3. Small Schmorl's node is present in the inferior T12 endplate.  T11-T12 through L3-L4 levels are normal.  L4-L5:  There is a broad-based posterior disc bulge.  Mild central stenosis is present.  There is crowding both lateral recesses potentially affecting both descending L5 nerves.  Short pedicles are present.  The foramina appear adequately patent.  Mild facet hypertrophy.  L5-S1:  Severe disc degeneration and collapse.  Mild to moderate central stenosis is present.  There is a circumferential disc bulge with central disc protrusion.  There is  caudal extension of disc material to the right of the midline compatible with sequestered disc fragment extending 1 cm caudal to the superior S1 endplate. The sequestered fragment measures 6 mm AP, 9 mm transverse and about 1 cm cranial-caudal. There is bilateral lateral recess stenosis just below the level of the disc space associated with disc herniation potentially affecting both descending S1 nerves. The foramina appear adequately patent.  The prior CT demonstrates partial calcification of disc protrusion.  IMPRESSION: 1.  L5-S1 central disc protrusion with small sequestered fragment that appears distinct from the disc extending caudally in the right paracentral region.  Protrusion and disc fragment produce bilateral lateral recess stenosis potentially affecting both S1 nerves.  Mild  to moderate central stenosis. 2.  L4-L5 mild central stenosis with bilateral lateral recess stenosis potentially affecting both descending L5 nerves.  Original Report Authenticated By: Andreas Newport, M.D.     1. Back pain       MDM  A 46 year-old female with exacerbation of chronic low back pain. No red flags for back pain. Neurologically nonfocal full-strength. I will attempt to control pain with IV morphine a walk the patient to verify a coordinated gait after pain is controlled. Plan to DC with close followup by her orthopedist Dr. Yevette Edwards.   0900 Patient ambulated to the restroom with support and severe exacerbation of pain.   9:28 AM  Patient has received 2 doses of 4 mg of morphine with no significant impact on her level of pain. I will give her 2 mg of Dilaudid. Patient's blood pressure remained stable  Consults from orthopedic PA Georjean Mode appreciated: She recommends expediting MRI which they will follow up close outpatient followup.  Patient reports significant improvement of pain after first 2 mg of Dilaudid dose. Because of the patient's relatively unsteady gait MRI of lumbar spine is indicated  at this time.  12:00 PM patient's temperature found to be 95.8 F rectally. Will DC patient's IV fluids and initiate bear hugger. Patient mentating normally. Reports she does feel chilly.  Patient's temperature was rechecked rectally with another thermometer while I was in the room and found to be 95.3C. B-air Hugger initiated.  Will change MRI to IV contrast to r/o epidural abscess I will send 2 blood cultures, CBC, lactate. Urinalysis is within normal limits.   Joni Reining Goro Wenrick, PA-C 01/17/12 1220  Pt's temperature normalized dn remains consistently within normal limits. Pt's pain is controlled and MRI shows no acute findings. Walked patient who displayed coordinated gait much improved with pain control. I will d/c the patient with Vicodin, sennakot and zofran. I will instruct Patient to follow tomorrow with her orthopedist.  Shared visit with attending.  Pt verbalized understanding and agrees with care plan. Outpatient follow-up and return precautions given.     Wynetta Emery, PA-C 01/17/12 1601

## 2012-01-17 NOTE — ED Provider Notes (Signed)
Medical screening examination/treatment/procedure(s) were conducted as a shared visit with non-physician practitioner(s) and myself.  I personally evaluated the patient during the encounter  Doug Sou, MD 01/17/12 [phone]

## 2012-01-17 NOTE — ED Notes (Signed)
PA at bedside.

## 2012-01-17 NOTE — ED Provider Notes (Signed)
Complains of low back pain radiating to right proximal hip onset 3 days ago a pain worse with movement no incontinence no fever no recent injury/. On exam appears uncomfortable Glasgow Coma Score 15 moves all extremities  DTRs symmetric bilaterally at knee jerk ankle jerk.toes downward going bilaterally   Doug Sou, MD 01/17/12 0865

## 2012-01-17 NOTE — ED Notes (Signed)
Pt in no acute distress and states that her pain has decreased

## 2012-01-24 LAB — CULTURE, BLOOD (ROUTINE X 2): Culture: NO GROWTH

## 2012-01-27 ENCOUNTER — Telehealth: Payer: Self-pay | Admitting: Family Medicine

## 2012-01-27 NOTE — Telephone Encounter (Signed)
Notes Recorded by Arnette Norris, CMA on 01/26/2012 at 5:01 PM msg left on machine @ [phone] [facility])  Notes Recorded by Lelon Perla, DO on 01/25/2012 at 6:02 PM I would prefer Neurosurgery because its her spine but if she is comfortable with her ortho she can see him. Notes Recorded by Arnette Norris, CMA on 01/25/2012 at 4:13 PM Discussed with patient and she voiced understanding, she also had an MRI done which showed a bulging disc she has an ortho but would like to know should she see Neuro for surgery. Please advise KP Notes Recorded by Arnette Norris, CMA on 01/25/2012 at 2:37 PM msg left on machine @ [phone] [facility]) Notes Recorded by Lelon Perla, DO on 01/24/2012 at 9:45 AM Neg blood culture

## 2012-01-27 NOTE — Telephone Encounter (Signed)
Returned our call please call at [phone]

## 2012-01-27 NOTE — Telephone Encounter (Signed)
Discussed with patient and made her aware if she will need a referral she will need to be seen in the office. She said she will talk it over with her husband and give is a call back.       KP

## 2012-02-02 ENCOUNTER — Ambulatory Visit: Payer: 59 | Admitting: Family Medicine

## 2012-11-14 ENCOUNTER — Other Ambulatory Visit: Payer: Self-pay

## 2012-11-14 DIAGNOSIS — Z1231 Encounter for screening mammogram for malignant neoplasm of breast: Secondary | ICD-10-CM

## 2012-11-14 DIAGNOSIS — Z9882 Breast implant status: Secondary | ICD-10-CM

## 2012-12-14 ENCOUNTER — Ambulatory Visit
Admission: RE | Admit: 2012-12-14 | Discharge: 2012-12-14 | Disposition: A | Discharge status: Home / Self Care | Payer: 59 | Source: Ambulatory Visit

## 2012-12-14 DIAGNOSIS — Z1231 Encounter for screening mammogram for malignant neoplasm of breast: Secondary | ICD-10-CM

## 2012-12-14 DIAGNOSIS — Z9882 Breast implant status: Secondary | ICD-10-CM

## 2013-08-20 ENCOUNTER — Other Ambulatory Visit: Payer: Self-pay | Admitting: Obstetrics & Gynecology

## 2013-11-13 ENCOUNTER — Telehealth: Payer: Self-pay | Admitting: Family Medicine

## 2013-11-13 NOTE — Telephone Encounter (Signed)
Caller name: Alyra  Call back [phone]  Reason for call:  Pt called in about place on bottom; pt placed on schedule for 5/29.  Pt also mentioned pain in arm and numbness in hand for the past week; conference pt call with CAN.  Left call back number for CAN to reach pt.

## 2013-11-13 NOTE — Telephone Encounter (Signed)
Spoke with patient.  Symptoms as reported.  Confirmed appointment scheduled for Friday, May 29th with Dr. Laury Axon.

## 2013-11-13 NOTE — Telephone Encounter (Signed)
Patient Information:  Caller Name: Shiree  Phone: [phone]  Patient: Kimberly Carter Seen  Gender: Female  DOB: 11/09/1965  Age: 48 Years  PCP: Lelon Perla.  Pregnant: No  Office Follow Up:  Does the office need to follow up with this patient?: Yes  Instructions For The Office: Patietn will keep scheduled appt on Friday 11/15/13  RN Note:  Patient was given a scheduled appt for Friday at 1:15 pm.  wtih Dr. Laury Axon.  She cannot come into the office until then. Reviewed Home care instructions. Reviewed symptoms and side effects of medication Dietyhylopropian and encouraged her to discuss symptoms with OB/GYN.  Understanding expressed.  Symptoms  Reason For Call & Symptoms: Patient states onset 2 weeks ago with Upper Left arm pain at the deltoid. She states "something radiates down to the elbow.  She states it causes tingling in thumb and first finger.  Occurss all day long , intermittent .  Denies injury. +Neck discomfort on left side of her neck.  She has two buldging disc in her back. Denies chest pain or heart pain . No shortness of breath..  She is currently on Diethylporprion 75mg  taking once a day. Started 10/22/13 by OB/GYN  Reviewed Health History In EMR: Yes  Reviewed Medications In EMR: Yes  Reviewed Allergies In EMR: Yes  Reviewed Surgeries / Procedures: Yes  Date of Onset of Symptoms: 10/30/2013 OB / GYN:  LMP: Unknown  Guideline(s) Used:  Arm Pain  Disposition Per Guideline:   See Today or Tomorrow in Office  Reason For Disposition Reached:   Patient wants to be seen  Advice Given:  Call Back If:  Moderate pain (e.g. interferes with normal activities) lasts more than 3 days  You become worse.  Local Heat  (shower option): If stiffness lasts over 48 hours, relax in a hot shower twice a day and gently exercise the involved part under the falling water.  Rest:   You should try to avoid any exercise or activity that caused this pain for the next 3 days.  Patient Refused  Recommendation:  Patient Refused Appt, Patient Requests Appt At Later Date  Patient has office appt scheduled by staff on Friday with Dr. Laury Axon. She will keep that appt . and call back for questions, changes or concerns.

## 2013-11-15 ENCOUNTER — Ambulatory Visit (INDEPENDENT_AMBULATORY_CARE_PROVIDER_SITE_OTHER): Payer: 59 | Admitting: Family Medicine

## 2013-11-15 ENCOUNTER — Encounter: Payer: Self-pay | Admitting: Family Medicine

## 2013-11-15 VITALS — BP 120/72 | HR 76 | Temp 98.7°F | Wt 157.0 lb

## 2013-11-15 DIAGNOSIS — M25519 Pain in unspecified shoulder: Secondary | ICD-10-CM

## 2013-11-15 DIAGNOSIS — L900 Lichen sclerosus et atrophicus: Secondary | ICD-10-CM

## 2013-11-15 DIAGNOSIS — M25512 Pain in left shoulder: Secondary | ICD-10-CM

## 2013-11-15 DIAGNOSIS — L940 Localized scleroderma [morphea]: Secondary | ICD-10-CM

## 2013-11-15 DIAGNOSIS — J3489 Other specified disorders of nose and nasal sinuses: Secondary | ICD-10-CM

## 2013-11-15 MED ORDER — MOMETASONE FUROATE 0.1 % EX CREA: 1.0000 "application " | TOPICAL_CREAM | Freq: Every day | CUTANEOUS | Status: DC

## 2013-11-15 MED ORDER — MELOXICAM 15 MG PO TABS: 15.0000 mg | ORAL_TABLET | Freq: Every day | ORAL | Status: DC

## 2013-11-15 NOTE — Patient Instructions (Signed)
Lichen Sclerosus  Lichen sclerosus is a skin problem. It can happen on any part of the body, but it commonly involves the anal or genital areas. Lichen sclerosus is not an infection or a fungus. Girls and women are more commonly affected than boys and men.  CAUSES  The cause is not known. It could be the result of an overactive immune system or a lack of certain hormones. Lichen sclerosus is not passed from one person to another (not contagious).  SYMPTOMS  Your skin may have:  · Thin, wrinkled, white areas.  · Thickened white areas.  · Red and swollen patches.  · Tears or cracks.  · Bruising.  · Blood blisters.  · Severe itching.  You may also have pain, itching, or burning with urination. Constipation is also common in people with lichen sclerosus.  DIAGNOSIS  Your caregiver will do a physical exam. Sometimes, a tissue sample (biopsy) may be sent for testing.  TREATMENT  Treatment may involve putting a thin layer of medicated cream (topical steroid) over the areas with lichen sclerosus. Use the cream only as directed by your caregiver.   HOME CARE INSTRUCTIONS  · Only take over-the-counter or prescription medicines as directed by your caregiver.  · Keep the vaginal area as clean and dry as possible.  SEEK MEDICAL CARE IF:  You develop increasing pain, swelling, or redness.  Document Released: 10/27/2010 Document Revised: 08/29/2011 Document Reviewed: 10/27/2010  ExitCare® Patient Information ©2014 [employer].

## 2013-11-15 NOTE — Progress Notes (Signed)
Pre visit review using our clinic review tool, if applicable. No additional management support is needed unless otherwise documented below in the visit note. 

## 2013-11-15 NOTE — Progress Notes (Signed)
  Subjective:     Kimberly Carter is a 48 y.o. female who presents for evaluation of a rash involving the buttocks b/l. Rash started several years ago. Lesions are white, and flat in texture. Rash has changed over time--- it has become painful.  Rash is painful. Associated symptoms: none. Patient denies: abdominal pain, arthralgia, congestion, cough, crankiness, decrease in appetite, decrease in energy level, fever, headache, irritability, myalgia, nausea, sore throat and vomiting. Patient has not had contacts with similar rash. Patient has not had new exposures (soaps, lotions, laundry detergents, foods, medications, plants, insects or animals).  Pt also c/o L arm pain with numbness in thumb/ hand x several months.  No known in  The following portions of the patient's history were reviewed and updated as appropriate: allergies, current medications, past family history, past medical history, past social history, past surgical history and problem list.  Review of Systems Pertinent items are noted in HPI.    Objective:    BP 120/72  Pulse 76  Temp(Src) 98.7 F (37.1 C) (Oral)  Wt 157 lb (71.215 kg)  SpO2 95% General:  alert, cooperative, appears stated age and no distress  Skin:  plaque noted on buttocks--white               Nose-- sore in nostril          L shoulder-- pain in shoulder and arm with numbness thumb.  Full rom        Assessment:    lichen sclerosis    Plan:    Medications: elocon. Written patient instruction given. Follow up in a few weeks.   1. Lichen sclerosus   - mometasone (ELOCON) 0.1 % cream; Apply 1 application topically daily.  Dispense: 45 g; Refill: 0  2. Left shoulder pain   - meloxicam (MOBIC) 15 MG tablet; Take 1 tablet (15 mg total) by mouth daily.  Dispense: 30 tablet; Refill: 0  3. Nasal sore Neosporin

## 2013-11-16 ENCOUNTER — Telehealth: Payer: Self-pay | Admitting: Family Medicine

## 2013-11-16 NOTE — Telephone Encounter (Signed)
Relevant patient education mailed to patient.  

## 2013-12-02 ENCOUNTER — Ambulatory Visit (INDEPENDENT_AMBULATORY_CARE_PROVIDER_SITE_OTHER): Payer: 59 | Admitting: Family

## 2013-12-02 ENCOUNTER — Encounter: Payer: Self-pay | Admitting: Family

## 2013-12-02 ENCOUNTER — Telehealth: Payer: Self-pay | Admitting: Family Medicine

## 2013-12-02 VITALS — BP 120/80 | HR 75 | Temp 98.5°F | Wt 160.0 lb

## 2013-12-02 DIAGNOSIS — R062 Wheezing: Secondary | ICD-10-CM

## 2013-12-02 DIAGNOSIS — L299 Pruritus, unspecified: Secondary | ICD-10-CM

## 2013-12-02 DIAGNOSIS — Z91038 Other insect allergy status: Secondary | ICD-10-CM

## 2013-12-02 DIAGNOSIS — Z91030 Bee allergy status: Secondary | ICD-10-CM

## 2013-12-02 MED ORDER — METHYLPREDNISOLONE ACETATE 40 MG/ML IJ SUSP
80.0000 mg | Freq: Once | INTRAMUSCULAR | Status: AC
  Administered 2013-12-02: 80 mg via INTRAMUSCULAR

## 2013-12-02 MED ORDER — METHYLPREDNISOLONE 4 MG PO KIT: PACK | ORAL | Status: AC

## 2013-12-02 NOTE — Patient Instructions (Signed)
Bee, Wasp, or Hornet Sting Your caregiver has diagnosed you as having an insect sting. An insect sting appears as a red lump in the skin that sometimes has a tiny hole in the center, or it may have a stinger in the center of the wound. The most common stings are from wasps, hornets and bees. Individuals have different reactions to insect stings.  A normal reaction may cause pain, swelling, and redness around the sting site.  A localized allergic reaction may cause swelling and redness that extends beyond the sting site.  A large local reaction may continue to develop over the next 12 to 36 hours.  On occasion, the reactions can be severe (anaphylactic reaction). An anaphylactic reaction may cause wheezing; difficulty breathing; chest pain; fainting; raised, itchy, red patches on the skin; a sick feeling to your stomach (nausea); vomiting; cramping; or diarrhea. If you have had an anaphylactic reaction to an insect sting in the past, you are more likely to have one again. HOME CARE INSTRUCTIONS   With bee stings, a small sac of poison is left in the wound. Brushing across this with something such as a credit card, or anything similar, will help remove this and decrease the amount of the reaction. This same procedure will not help a wasp sting as they do not leave behind a stinger and poison sac.  Apply a cold compress for 10 to 20 minutes every hour for 1 to 2 days, depending on severity, to reduce swelling and itching.  To lessen pain, a paste made of water and baking soda may be rubbed on the bite or sting and left on for 5 minutes.  To relieve itching and swelling, you may use take medication or apply medicated creams or lotions as directed.  Only take over-the-counter or prescription medicines for pain, discomfort, or fever as directed by your caregiver.  Wash the sting site daily with soap and water. Apply antibiotic ointment on the sting site as directed.  If you suffered a severe  reaction:  If you did not require hospitalization, an adult will need to stay with you for 24 hours in case the symptoms return.  You may need to wear a medical bracelet or necklace stating the allergy.  You and your family need to learn when and how to use an anaphylaxis kit or epinephrine injection.  If you have had a severe reaction before, always carry your anaphylaxis kit with you. SEEK MEDICAL CARE IF:   None of the above helps within 2 to 3 days.  The area becomes red, warm, tender, and swollen beyond the area of the bite or sting.  You have an oral temperature above 102 F (38.9 C). SEEK IMMEDIATE MEDICAL CARE IF:  You have symptoms of an allergic reaction which are:  Wheezing.  Difficulty breathing.  Chest pain.  Lightheadedness or fainting.  Itchy, raised, red patches on the skin.  Nausea, vomiting, cramping or diarrhea. ANY OF THESE SYMPTOMS MAY REPRESENT A SERIOUS PROBLEM THAT IS AN EMERGENCY. Do not wait to see if the symptoms will go away. Get medical help right away. Call your local emergency services (911 in [place] DO NOT drive yourself to the hospital. MAKE SURE YOU:   Understand these instructions.  Will watch your condition.  Will get help right away if you are not doing well or get worse. Document Released: 06/06/2005 Document Revised: 08/29/2011 Document Reviewed: 11/21/2009 ExitCare Patient Information 2014 [employer].  

## 2013-12-02 NOTE — Progress Notes (Signed)
Subjective:    Patient ID: Kimberly Carter, female    DOB: 1965/08/19, 48 y.o.   MRN: [id number]  HPI  48 year old 1/2ppd smoking Caucasian female presents for acute visit following a bee sting on Saturday, on the posterior aspect of left thigh. She reports swelling at the site increased yesterday, with increased itching. Reports only one affected area.  She specifically denies airway compromise, facial swelling, or rash elsewhere on the body.   Review of Systems  Constitutional: Negative.   HENT: Negative.   Eyes: Negative.   Respiratory: Positive for wheezing.        With seasonal allergies, not a new issue.   Cardiovascular: Negative.   Gastrointestinal: Negative.   Endocrine: Negative.   Genitourinary: Negative.   Musculoskeletal: Negative.   Skin: Positive for color change.       Red swollen area on posterior left thigh   Allergic/Immunologic: Positive for environmental allergies.  Neurological: Negative.   Hematological: Negative.   Psychiatric/Behavioral: Negative.    Past Medical History  Diagnosis Date  . Hypertension   . Genital herpes   . Allergic rhinitis     History   Social History  . Marital Status: Married    Spouse Name: N/A    Number of Children: N/A  . Years of Education: N/A   Occupational History  . Not on file.   Social History Main Topics  . Smoking status: Current Every Day Smoker -- 0.50 packs/day    Types: Cigarettes  . Smokeless tobacco: Never Used     Comment: passive smoker  . Alcohol Use: Yes     Comment: occ.  . Drug Use: No  . Sexual Activity:    Other Topics Concern  . Not on file   Social History Narrative  . No narrative on file    Past Surgical History  Procedure Laterality Date  . Mandible surgery    . Breast enhancement surgery    . Hemorrhoid surgery    . Cholecystectomy      Family History  Problem Relation Age of Onset  . Hypertension Other   . Diabetes Other     Allergies  Allergen Reactions  .  Amoxicillin-Pot Clavulanate     REACTION: diarrhea  . Codeine Other (See Comments)    hallucinate    Current Outpatient Prescriptions on File Prior to Visit  Medication Sig Dispense Refill  . atenolol (TENORMIN) 100 MG tablet Take 100 mg by mouth daily.       . Diethylpropion HCl CR 75 MG TB24 Take 1 tablet by mouth daily.      Marland Kitchen. ibuprofen (ADVIL,MOTRIN) 200 MG tablet Take 800 mg by mouth every 6 (six) hours as needed. For pain      . meloxicam (MOBIC) 15 MG tablet Take 1 tablet (15 mg total) by mouth daily.  30 tablet  0  . mometasone (ELOCON) 0.1 % cream Apply 1 application topically daily.  45 g  0  . sertraline (ZOLOFT) 25 MG tablet Take 50 mg by mouth daily.       Marland Kitchen. triamterene-hydrochlorothiazide (MAXZIDE-25) 37.5-25 MG per tablet Take 1 tablet by mouth daily as needed. fluid      . valACYclovir (VALTREX) 1000 MG tablet Take 500 mg by mouth daily.        No current facility-administered medications on file prior to visit.    BP 120/80  Pulse 75  Temp(Src) 98.5 F (36.9 C)  Wt 160 lb (72.576 kg)  Objective:   Physical Exam  Constitutional: She is oriented to person, place, and time. She appears well-developed and well-nourished. No distress.  HENT:  Head: Normocephalic and atraumatic.  Mouth/Throat: Oropharynx is clear and moist.  Eyes: EOM are normal. Pupils are equal, round, and reactive to light.  Neck: Normal range of motion. Neck supple.  Cardiovascular: Normal rate, regular rhythm and normal heart sounds.   Pulmonary/Chest: Effort normal. She has wheezes.  Diffuse inspiratory wheezing throughout lung fields.  Negative airway compromise with hyperinflation  Abdominal: Soft. Bowel sounds are normal.  Musculoskeletal: Normal range of motion.  Lymphadenopathy:    She has no cervical adenopathy.  Neurological: She is alert and oriented to person, place, and time. She has normal reflexes.  Skin: Skin is warm and dry. She is not diaphoretic. There is erythema.  10  x 12 cm erythematous cellulitis, no streaking. Warm to touch.    Psychiatric: She has a normal mood and affect. Her behavior is normal. Judgment and thought content normal.       Assessment & Plan:  Barth Kirkseri was seen today for insect bite.  Diagnoses and associated orders for this visit:  Bee sting allergy - methylPREDNISolone acetate (DEPO-MEDROL) injection 80 mg; Inject 2 mLs (80 mg total) into the muscle once. - methylPREDNISolone (MEDROL DOSEPAK) 4 MG tablet; follow package directions  Pruritus due to inflammation  Wheezing

## 2013-12-02 NOTE — Progress Notes (Signed)
Pre visit review using our clinic review tool, if applicable. No additional management support is needed unless otherwise documented below in the visit note. 

## 2013-12-02 NOTE — Telephone Encounter (Signed)
Patient Information:  Caller Name: Barth Kirkseri  Phone: [phone]  Patient: Kimberly Carter, Kimberly Carter  Gender: Female  DOB: 1965-08-27  Age: 48 Years  PCP: Lelon PerlaLowne, Yvonne R.  Pregnant: No  Office Follow Up:  Does the office need to follow up with this patient?: No  Instructions For The Office: N/A   Symptoms  Reason For Call & Symptoms: Bee/ wasp sting 11/30/13.  Red area larger than softball, swollen, hot and very itchy.  This is on left posterior thigh.  Emergent symptoms ruled out.  See Today in Office per Bee Sting guideline due to Red or very tender to touch area and started over 24 hours after the sting.  Reviewed Health History In EMR: Yes  Reviewed Medications In EMR: Yes  Reviewed Allergies In EMR: Yes  Reviewed Surgeries / Procedures: Yes  Date of Onset of Symptoms: 11/30/2013  Treatments Tried: Benadryl,  Antibiotic Ointment. with some relief from itching.  Treatments Tried Worked: No OB / GYN:  LMP: Unknown  Guideline(s) Used:  Tax inspectornsect Bite  Bee Sting  Disposition Per Guideline:   See Today in Office  Reason For Disposition Reached:   Red or very tender (to touch) area, and started over 24 hours after the sting  Advice Given:  Apply Cold to the Area for Pain - Cold Pack Method:  Wrap a bag of ice in a towel (or use a bag of frozen vegetables such as peas).  You may repeat this as needed, to relieve symptoms of pain and swelling.  Pain Medicines:  For pain relief, you can take either acetaminophen, ibuprofen, or naproxen.  They are over-the-counter (OTC) pain drugs. You can buy them at the drugstore.  Call Back If:  Sting begins to look infected  You become worse.  Patient Will Follow Care Advice:  YES  Appointment Scheduled:  12/02/2013 14:00:00 Appointment Scheduled Provider:  Adline Mangoampbell, [place]

## 2013-12-03 ENCOUNTER — Telehealth: Payer: Self-pay | Admitting: Family Medicine

## 2013-12-03 NOTE — Telephone Encounter (Signed)
Relevant patient education mailed to patient.  

## 2014-03-10 ENCOUNTER — Other Ambulatory Visit: Payer: Self-pay

## 2014-03-10 DIAGNOSIS — Z1231 Encounter for screening mammogram for malignant neoplasm of breast: Secondary | ICD-10-CM

## 2014-03-21 ENCOUNTER — Encounter (INDEPENDENT_AMBULATORY_CARE_PROVIDER_SITE_OTHER): Payer: Self-pay

## 2014-03-21 ENCOUNTER — Ambulatory Visit
Admission: RE | Admit: 2014-03-21 | Discharge: 2014-03-21 | Disposition: A | Discharge status: Home / Self Care | Payer: 59 | Source: Ambulatory Visit

## 2014-03-21 DIAGNOSIS — Z1231 Encounter for screening mammogram for malignant neoplasm of breast: Secondary | ICD-10-CM

## 2014-10-14 ENCOUNTER — Other Ambulatory Visit: Payer: Self-pay | Admitting: Obstetrics & Gynecology

## 2014-10-16 LAB — CYTOLOGY - PAP

## 2015-04-09 ENCOUNTER — Other Ambulatory Visit: Payer: Self-pay

## 2015-04-09 DIAGNOSIS — Z1231 Encounter for screening mammogram for malignant neoplasm of breast: Secondary | ICD-10-CM

## 2015-04-24 ENCOUNTER — Ambulatory Visit
Admission: RE | Admit: 2015-04-24 | Discharge: 2015-04-24 | Disposition: A | Discharge status: Home / Self Care | Payer: 59 | Source: Ambulatory Visit

## 2015-04-24 ENCOUNTER — Ambulatory Visit (HOSPITAL_COMMUNITY)
Admission: RE | Admit: 2015-04-24 | Discharge: 2015-04-24 | Disposition: A | Discharge status: Home / Self Care | Payer: Self-pay | Source: Ambulatory Visit | Admitting: Radiology

## 2015-04-24 DIAGNOSIS — Z1231 Encounter for screening mammogram for malignant neoplasm of breast: Secondary | ICD-10-CM

## 2015-11-03 ENCOUNTER — Encounter: Payer: Self-pay | Admitting: Medical

## 2015-11-03 ENCOUNTER — Other Ambulatory Visit: Payer: Self-pay | Admitting: Medical

## 2015-11-03 ENCOUNTER — Ambulatory Visit (HOSPITAL_BASED_OUTPATIENT_CLINIC_OR_DEPARTMENT_OTHER)
Admission: RE | Admit: 2015-11-03 | Discharge: 2015-11-03 | Disposition: A | Discharge status: Home / Self Care | Payer: BLUE CROSS/BLUE SHIELD | Source: Ambulatory Visit | Attending: Medical | Admitting: Medical

## 2015-11-03 ENCOUNTER — Ambulatory Visit (INDEPENDENT_AMBULATORY_CARE_PROVIDER_SITE_OTHER): Payer: BLUE CROSS/BLUE SHIELD | Admitting: Medical

## 2015-11-03 VITALS — BP 116/74 | HR 51 | Temp 98.0°F | Wt 151.6 lb

## 2015-11-03 DIAGNOSIS — M79642 Pain in left hand: Secondary | ICD-10-CM | POA: Diagnosis not present

## 2015-11-03 DIAGNOSIS — R0781 Pleurodynia: Secondary | ICD-10-CM | POA: Insufficient documentation

## 2015-11-03 DIAGNOSIS — R0789 Other chest pain: Secondary | ICD-10-CM

## 2015-11-03 MED ORDER — DICLOFENAC SODIUM 75 MG PO TBEC: 75.0000 mg | DELAYED_RELEASE_TABLET | Freq: Two times a day (BID) | ORAL | Status: DC

## 2015-11-03 MED ORDER — HYDROCODONE-ACETAMINOPHEN 5-325 MG PO TABS: 1.0000 | ORAL_TABLET | Freq: Four times a day (QID) | ORAL | Status: DC | PRN

## 2015-11-03 NOTE — Patient Instructions (Addendum)
Your pain over rib region may just be muscular type pain but I do think good idea to get cxr to look at ribs and evaluate the lungs.  For hand pain and injury one month ago will get xray to see if you suffered fracture of 5th metacarpal.   We will let you know results of the xrays when they are in.  Rx diclofenac for pain and inflammation. Stop other otc nsaids.  For pain rx norco   Rx advisement given.  Follow up in 7-10 days or as needed

## 2015-11-03 NOTE — Progress Notes (Signed)
Subjective:    Patient ID: Kimberly Carter, female    DOB: April 13, 1966, 50 y.o.   MRN: [id number]  HPI  Pt in with some rt side rib area pain. Pain since Thursday. Hurts to move, raise, arm breath, land lift things. No fall or injury. No rash on her skin. Pt was stretching and painting recently. May have overstretched painting per pt and husband. Pt states not used to pt prologed painting.  Pt states she fell about one month ago. Boxer jumped on her and she fell. Hit her left hand and immediately she had pain and swelling over her left 5th metacarpal area. She had bruised initially. Area has gone down in swelling but occasionally very painful to touch.   Pt is taking ibuprofen. Helps some with pain on breathing. She took 800 mg this am.    Review of Systems  Constitutional: Negative for fever, chills and fatigue.  HENT: Negative for congestion and sinus pressure.   Respiratory: Negative for cough, chest tightness, shortness of breath and wheezing.   Cardiovascular: Negative for chest pain and palpitations.  Musculoskeletal:       See hpi.  Rt rib pain and left hand pain.  Skin: Negative for rash.  Hematological: Negative for adenopathy. Does not bruise/bleed easily.    Past Medical History  Diagnosis Date  . Hypertension   . Genital herpes   . Allergic rhinitis      Social History   Social History  . Marital Status: Married    Spouse Name: N/A  . Number of Children: N/A  . Years of Education: N/A   Occupational History  . Not on file.   Social History Main Topics  . Smoking status: Current Every Day Smoker -- 0.50 packs/day    Types: Cigarettes  . Smokeless tobacco: Never Used     Comment: passive smoker  . Alcohol Use: Yes     Comment: occ.  . Drug Use: No  . Sexual Activity: Not on file   Other Topics Concern  . Not on file   Social History Narrative    Past Surgical History  Procedure Laterality Date  . Mandible surgery    . Breast enhancement surgery     . Hemorrhoid surgery    . Cholecystectomy      Family History  Problem Relation Age of Onset  . Hypertension Other   . Diabetes Other     Allergies  Allergen Reactions  . Amoxicillin-Pot Clavulanate     REACTION: diarrhea  . Codeine Other (See Comments)    hallucinate    Current Outpatient Prescriptions on File Prior to Visit  Medication Sig Dispense Refill  . atenolol (TENORMIN) 100 MG tablet Take 100 mg by mouth daily.     . Diethylpropion HCl CR 75 MG TB24 Take 1 tablet by mouth daily.    Marland Kitchen. ibuprofen (ADVIL,MOTRIN) 200 MG tablet Take 800 mg by mouth every 6 (six) hours as needed. For pain    . meloxicam (MOBIC) 15 MG tablet Take 1 tablet (15 mg total) by mouth daily. 30 tablet 0  . mometasone (ELOCON) 0.1 % cream Apply 1 application topically daily. 45 g 0  . sertraline (ZOLOFT) 25 MG tablet Take 50 mg by mouth daily.     Marland Kitchen. triamterene-hydrochlorothiazide (MAXZIDE-25) 37.5-25 MG per tablet Take 1 tablet by mouth daily as needed. fluid    . valACYclovir (VALTREX) 1000 MG tablet Take 500 mg by mouth daily.      No current  facility-administered medications on file prior to visit.    BP 116/74 mmHg  Pulse 51  Temp(Src) 98 F (36.7 C) (Oral)  Wt 151 lb 9.6 oz (68.765 kg)  SpO2 98%       Objective:   Physical Exam  General- No acute distress. Pleasant patient. Neck- Full range of motion, no jvd Lungs- Clear, even and unlabored. Heart- regular rate and rhythm. Neurologic- CNII- XII grossly intact. Rt rib- tenderness to palpation and movement ribs at level just  under  Rt breast  And wrapping around posterior thorax. No rash seen. Pain on deep breathing and rotating thorax.  Lt hand- no swelling or bruising. But lt 5th metacarpal small buldge mid aspect.      Assessment & Plan:  Your pain over rib region may just be muscular type pain but I do think good idea to get cxr to look at ribs and evaluate the lungs.  For hand pain and injury one month ago will get  xray to see if you suffered fracture of 5th metacarpal.   We will let you know results of the xrays when they are in.  Rx diclofenac for pain and inflammation. Stop other otc nsaids.  For pain rx norco. Rx advisement given.(pt is on sertraline so did no rx tramadol)  Follow up in 7-10 days or as needed    Johnie Stadel, Ramon Dredge, [employer]

## 2015-11-03 NOTE — Progress Notes (Signed)
Pre visit review using our clinic review tool, if applicable. No additional management support is needed unless otherwise documented below in the visit note. 

## 2015-11-03 NOTE — Addendum Note (Signed)
Addended by: Gwenevere AbbotSAGUIER, Quinnten Calvin M on: 11/03/2015 11:07 AM   Modules accepted: Level of Service

## 2015-12-07 ENCOUNTER — Other Ambulatory Visit: Payer: Self-pay | Admitting: Obstetrics & Gynecology

## 2016-02-10 ENCOUNTER — Ambulatory Visit (HOSPITAL_BASED_OUTPATIENT_CLINIC_OR_DEPARTMENT_OTHER)
Admission: RE | Admit: 2016-02-10 | Discharge: 2016-02-10 | Disposition: A | Discharge status: Home / Self Care | Payer: BLUE CROSS/BLUE SHIELD | Source: Ambulatory Visit | Attending: Internal Medicine | Admitting: Internal Medicine

## 2016-02-10 ENCOUNTER — Encounter: Payer: Self-pay | Admitting: Internal Medicine

## 2016-02-10 ENCOUNTER — Ambulatory Visit (INDEPENDENT_AMBULATORY_CARE_PROVIDER_SITE_OTHER): Payer: BLUE CROSS/BLUE SHIELD | Admitting: Internal Medicine

## 2016-02-10 VITALS — BP 102/60 | HR 58 | Temp 97.8°F | Resp 16 | Wt 151.8 lb

## 2016-02-10 DIAGNOSIS — M549 Dorsalgia, unspecified: Secondary | ICD-10-CM | POA: Insufficient documentation

## 2016-02-10 DIAGNOSIS — L989 Disorder of the skin and subcutaneous tissue, unspecified: Secondary | ICD-10-CM

## 2016-02-10 DIAGNOSIS — R079 Chest pain, unspecified: Secondary | ICD-10-CM | POA: Diagnosis not present

## 2016-02-10 MED ORDER — DICLOFENAC SODIUM 75 MG PO TBEC: 75.0000 mg | DELAYED_RELEASE_TABLET | Freq: Two times a day (BID) | ORAL | 0 refills | Status: DC

## 2016-02-10 NOTE — Patient Instructions (Signed)
Get the XR  diclofenac   as needed for pain.  Always take it with food because may cause gastritis and ulcers.  If you notice nausea, stomach pain, change in the color of stools --->  Stop the medicine and let us know   OK  tylenol as well  Call if no better in 10 days or if fever, chills, rash, difficulty breathing

## 2016-02-10 NOTE — Progress Notes (Signed)
Pre visit review using our clinic review tool, if applicable. No additional management support is needed unless otherwise documented below in the visit note. 

## 2016-02-10 NOTE — Progress Notes (Signed)
Subjective:    Patient ID: Kimberly Carter, female    DOB: 1966/05/24, 50 y.o.   MRN: [id number]  DOS:  02/10/2016 Type of visit - description : Acute visit Interval history: Complaining of left mid back pain for the last 3 weeks, worse with deep breaths or moving her torso, does not recall any particular injury although from time to time she does some lifting at work. No change on the symptoms when she eats, no abdominal pain.  Also, has a new skin lesion  at the left chest  Also had a cut on the right heel, wonders if it is infected.    Review of Systems  No fever chills No recent URI or virus No weight loss No difficulty breathing No cough or sputum production No nausea vomiting No recent airplane or prolonged car trips. No calf pain. Patient is a smoker.  Past Medical History:  Diagnosis Date  . Allergic rhinitis   . Genital herpes   . Hypertension     Past Surgical History:  Procedure Laterality Date  . BREAST ENHANCEMENT SURGERY    . CHOLECYSTECTOMY    . HEMORRHOID SURGERY    . MANDIBLE SURGERY      Social History   Social History  . Marital status: Married    Spouse name: N/A  . Number of children: N/A  . Years of education: N/A   Occupational History  . Not on file.   Social History Main Topics  . Smoking status: Current Every Day Smoker    Packs/day: 0.50    Types: Cigarettes  . Smokeless tobacco: Never Used     Comment: passive smoker  . Alcohol use Yes     Comment: occ.  . Drug use: No  . Sexual activity: Not on file   Other Topics Concern  . Not on file   Social History Narrative  . No narrative on file        Medication List       Accurate as of 02/10/16 11:59 PM. Always use your most recent med list.          atenolol 100 MG tablet Commonly known as:  TENORMIN Take 100 mg by mouth daily.   diclofenac 75 MG EC tablet Commonly known as:  VOLTAREN Take 1 tablet (75 mg total) by mouth 2 (two) times daily.   ibuprofen 200  MG tablet Commonly known as:  ADVIL,MOTRIN Take 800 mg by mouth every 6 (six) hours as needed. For pain   levonorgestrel 20 MCG/24HR IUD Commonly known as:  MIRENA 1 each by Intrauterine route once.   sertraline 25 MG tablet Commonly known as:  ZOLOFT Take 50 mg by mouth daily.   triamterene-hydrochlorothiazide 37.5-25 MG tablet Commonly known as:  MAXZIDE-25 Take 1 tablet by mouth daily as needed. fluid   VALTREX 1000 MG tablet Generic drug:  valACYclovir Take 500 mg by mouth daily.          Objective:   Physical Exam  Musculoskeletal:       Arms: Skin:      BP 102/60 (BP Location: Left Arm, Patient Position: Sitting, Cuff Size: Normal)   Pulse (!) 58   Temp 97.8 F (36.6 C) (Oral)   Resp 16   Wt 151 lb 12.8 oz (68.9 kg)   SpO2 97%   BMI 23.78 kg/m  General:   Well developed, well nourished . NAD.  HEENT:  Normocephalic . Face symmetric, atraumatic Lungs:  CTA B Normal respiratory  effort, no intercostal retractions, no accessory muscle use. Heart: RRR,  no murmur.  no pretibial edema bilaterally  Abdomen:  Not distended, soft, non-tender. No rebound or rigidity.  Neurologic:  alert & oriented X3.  Speech normal, gait appropriate for age and unassisted Psych--  Cognition and judgment appear intact.  Cooperative with normal attention span and concentration.  Behavior appropriate. No anxious or depressed appearing.    Assessment & Plan:   Left posterior chest pain: No red flag symptoms, had similar episodes few weeks ago on the right side. Responded well to NSAIDs. Plan: Chest x-ray, diclofenac, GI precautions, Tylenol. Patient to call if not improving  Skin lesion left chest: new per pt, SK? Likely needs a excision, refer to dermatology  Abrasion, small cut  right heel: Does not seem infected ; patient to let me know if discharge,  more swelling or not completely heal soon

## 2016-05-24 ENCOUNTER — Encounter: Payer: Self-pay | Admitting: Medical

## 2016-05-24 ENCOUNTER — Ambulatory Visit (INDEPENDENT_AMBULATORY_CARE_PROVIDER_SITE_OTHER): Payer: BLUE CROSS/BLUE SHIELD | Admitting: Medical

## 2016-05-24 VITALS — BP 110/64 | HR 65 | Temp 98.2°F | Wt 154.4 lb

## 2016-05-24 DIAGNOSIS — J0100 Acute maxillary sinusitis, unspecified: Secondary | ICD-10-CM | POA: Diagnosis not present

## 2016-05-24 DIAGNOSIS — J209 Acute bronchitis, unspecified: Secondary | ICD-10-CM | POA: Diagnosis not present

## 2016-05-24 DIAGNOSIS — R05 Cough: Secondary | ICD-10-CM

## 2016-05-24 DIAGNOSIS — R059 Cough, unspecified: Secondary | ICD-10-CM

## 2016-05-24 MED ORDER — FLUTICASONE PROPIONATE 50 MCG/ACT NA SUSP: 2.0000 | Freq: Every day | NASAL | 1 refills | Status: DC

## 2016-05-24 MED ORDER — HYDROCODONE-HOMATROPINE 5-1.5 MG/5ML PO SYRP: 5.0000 mL | ORAL_SOLUTION | Freq: Three times a day (TID) | ORAL | 0 refills | Status: DC | PRN

## 2016-05-24 MED ORDER — DOXYCYCLINE HYCLATE 100 MG PO TABS: 100.0000 mg | ORAL_TABLET | Freq: Two times a day (BID) | ORAL | 0 refills | Status: DC

## 2016-05-24 NOTE — Progress Notes (Signed)
Pre visit review using our clinic review tool, if applicable. No additional management support is needed unless otherwise documented below in the visit note. 

## 2016-05-24 NOTE — Progress Notes (Signed)
Subjective:    Patient ID: Kimberly Carter L [place], female    DOB: June 22, 1965, 50 y.o.   MRN: [id number]  HPI  Pt in with 3 week of nasal congestion. Symptoms had waxed and waned for 2 weeks. But last week constant sinus pressure and pain. Maxillary and frontal sinus pain. Pt has been coughing up mucous. Pt felt beginning of last week subjective fever.  Pt does smoke. About 1/2 pack a day.   LMP- mirena.  No wheezing reported.    Review of Systems  Constitutional: Negative for chills and fatigue.  HENT: Positive for congestion, sinus pain and sinus pressure. Negative for ear pain, mouth sores, postnasal drip, sneezing and sore throat.   Respiratory: Positive for cough. Negative for choking, shortness of breath and wheezing.   Cardiovascular: Negative for chest pain and palpitations.  Gastrointestinal: Negative for abdominal pain, nausea and vomiting.  Musculoskeletal: Negative for back pain, myalgias and neck stiffness.  Skin: Negative for rash.  Neurological: Negative for dizziness and headaches.       Pt clarifies had frontal sinus pain and not ha.  Hematological: Negative for adenopathy. Does not bruise/bleed easily.  Psychiatric/Behavioral: Negative for behavioral problems and confusion.    Past Medical History:  Diagnosis Date  . Allergic rhinitis   . Genital herpes   . Hypertension      Social History   Social History  . Marital status: Married    Spouse name: N/A  . Number of children: N/A  . Years of education: N/A   Occupational History  . Not on file.   Social History Main Topics  . Smoking status: Current Every Day Smoker    Packs/day: 0.50    Types: Cigarettes  . Smokeless tobacco: Never Used     Comment: passive smoker  . Alcohol use Yes     Comment: occ.  . Drug use: No  . Sexual activity: Not on file   Other Topics Concern  . Not on file   Social History Narrative  . No narrative on file    Past Surgical History:  Procedure Laterality Date  .  BREAST ENHANCEMENT SURGERY    . CHOLECYSTECTOMY    . HEMORRHOID SURGERY    . MANDIBLE SURGERY      Family History  Problem Relation Age of Onset  . Hypertension Other   . Diabetes Other     Allergies  Allergen Reactions  . Amoxicillin-Pot Clavulanate     REACTION: diarrhea    Current Outpatient Prescriptions on File Prior to Visit  Medication Sig Dispense Refill  . atenolol (TENORMIN) 100 MG tablet Take 100 mg by mouth daily.     . diclofenac (VOLTAREN) 75 MG EC tablet Take 1 tablet (75 mg total) by mouth 2 (two) times daily. 30 tablet 0  . ibuprofen (ADVIL,MOTRIN) 200 MG tablet Take 800 mg by mouth every 6 (six) hours as needed. For pain    . levonorgestrel (MIRENA) 20 MCG/24HR IUD 1 each by Intrauterine route once.    . sertraline (ZOLOFT) 25 MG tablet Take 50 mg by mouth daily.     Marland Kitchen. triamterene-hydrochlorothiazide (MAXZIDE-25) 37.5-25 MG per tablet Take 1 tablet by mouth daily as needed. fluid    . valACYclovir (VALTREX) 1000 MG tablet Take 500 mg by mouth daily.      No current facility-administered medications on file prior to visit.     BP 110/64 (BP Location: Right Arm, Patient Position: Sitting, Cuff Size: Normal)   Pulse 65  Temp 98.2 F (36.8 C) (Oral)   Wt 154 lb 6.4 oz (70 kg)   SpO2 98%   BMI 24.18 kg/m       Objective:   Physical Exam  General  Mental Status - Alert. General Appearance - Well groomed. Not in acute distress.  Skin Rashes- No Rashes.  HEENT Head- Normal. Ear Auditory Canal - Left- Normal. Right - Normal.Tympanic Membrane- Left- Normal. Right- Normal. Eye Sclera/Conjunctiva- Left- Normal. Right- Normal. Nose & Sinuses Nasal Mucosa- Left-  Boggy and Congested. Right-  Boggy and  Congested.Bilateral maxillary and frontal sinus pressure. Mouth & Throat Lips: Upper Lip- Normal: no dryness, cracking, pallor, cyanosis, or vesicular eruption. Lower Lip-Normal: no dryness, cracking, pallor, cyanosis or vesicular eruption. Buccal  Mucosa- Bilateral- No Aphthous ulcers. Oropharynx- No Discharge or Erythema. Tonsils: Characteristics- Bilateral- No Erythema or Congestion. Size/Enlargement- Bilateral- No enlargement. Discharge- bilateral-None.  Neck Neck- Supple. No Masses.   Chest and Lung Exam Auscultation: Breath Sounds:-Clear even and unlabored.  Cardiovascular Auscultation:Rythm- Regular, rate and rhythm. Murmurs & Other Heart Sounds:Ausculatation of the heart reveal- No Murmurs.  Lymphatic Head & Neck General Head & Neck Lymphatics: Bilateral: Description- No Localized lymphadenopathy.       Assessment & Plan:   You appear to have bronchitis and sinusitis(more sinus infection). Rest hydrate and tylenol for fever. I am prescribing cough medicine, and doxycycline antibiotic. For your nasal congestion rx flonase.  You should gradually get better. If not then notify us and would recommend a chest xray.  Follow up in 7-10 days or as needed

## 2016-05-24 NOTE — Patient Instructions (Addendum)
You appear to have bronchitis and sinusitis(more sinus infection). Rest hydrate and tylenol for fever. I am prescribing cough medicine, and doxycycline antibiotic. For your nasal congestion rx flonase.  You should gradually get better. If not then notify us and would recommend a chest xray.  Follow up in 7-10 days or as needed

## 2016-10-03 ENCOUNTER — Telehealth: Payer: Self-pay | Admitting: Family Medicine

## 2016-10-03 NOTE — Telephone Encounter (Signed)
Patient Name: Kimberly Carter  DOB: 10-10-1965    Initial Comment Caller states she has left arm pain and numbness from elbow to fingers.   Nurse Assessment  Nurse: Vickey Sages, RN, Jacquilin Date/Time (Eastern Time): 10/03/2016 9:32:15 AM  Confirm and document reason for call. If symptomatic, describe symptoms. ---Caller states she has left arm pain and numbness from elbow to fingers.- Pain started 1 month ago. No numbness in the shoulder. No problems with the leg or speech  Does the patient have any new or worsening symptoms? ---Yes  Will a triage be completed? ---Yes  Related visit to physician within the last 2 weeks? ---No  Does the PT have any chronic conditions? (i.e. diabetes, asthma, etc.) ---Yes  List chronic conditions. ---htn  Is the patient pregnant or possibly pregnant? (Ask all females between the ages of 72-55) ---No  Is this a behavioral health or substance abuse call? ---No     Guidelines    Guideline Title Affirmed Question Affirmed Notes  Arm Pain Numbness (i.e., loss of sensation) in hand or fingers    Final Disposition User   See Physician within 24 Hours Atkins, RN, Jacquilin    Referrals  REFERRED TO PCP OFFICE   Disagree/Comply: Comply

## 2016-10-04 ENCOUNTER — Ambulatory Visit (INDEPENDENT_AMBULATORY_CARE_PROVIDER_SITE_OTHER): Payer: BLUE CROSS/BLUE SHIELD | Admitting: Medical

## 2016-10-04 ENCOUNTER — Ambulatory Visit (HOSPITAL_BASED_OUTPATIENT_CLINIC_OR_DEPARTMENT_OTHER)
Admission: RE | Admit: 2016-10-04 | Discharge: 2016-10-04 | Disposition: A | Discharge status: Home / Self Care | Payer: BLUE CROSS/BLUE SHIELD | Source: Ambulatory Visit | Attending: Medical | Admitting: Medical

## 2016-10-04 ENCOUNTER — Encounter: Payer: Self-pay | Admitting: Medical

## 2016-10-04 VITALS — BP 133/71 | HR 70 | Temp 97.8°F | Resp 16 | Ht 67.0 in | Wt 156.0 lb

## 2016-10-04 DIAGNOSIS — M25512 Pain in left shoulder: Secondary | ICD-10-CM | POA: Diagnosis not present

## 2016-10-04 DIAGNOSIS — M792 Neuralgia and neuritis, unspecified: Secondary | ICD-10-CM

## 2016-10-04 DIAGNOSIS — M542 Cervicalgia: Secondary | ICD-10-CM

## 2016-10-04 DIAGNOSIS — M5410 Radiculopathy, site unspecified: Secondary | ICD-10-CM | POA: Diagnosis not present

## 2016-10-04 MED ORDER — CYCLOBENZAPRINE HCL 10 MG PO TABS: 10.0000 mg | ORAL_TABLET | Freq: Every day | ORAL | 0 refills | Status: DC

## 2016-10-04 MED ORDER — KETOROLAC TROMETHAMINE 60 MG/2ML IM SOLN
60.0000 mg | Freq: Once | INTRAMUSCULAR | Status: AC
  Administered 2016-10-04: 60 mg via INTRAMUSCULAR

## 2016-10-04 MED ORDER — DICLOFENAC SODIUM 75 MG PO TBEC: 75.0000 mg | DELAYED_RELEASE_TABLET | Freq: Two times a day (BID) | ORAL | 0 refills | Status: DC

## 2016-10-04 MED ORDER — HYDROCODONE-ACETAMINOPHEN 5-325 MG PO TABS: 1.0000 | ORAL_TABLET | Freq: Four times a day (QID) | ORAL | 0 refills | Status: DC | PRN

## 2016-10-04 NOTE — Progress Notes (Signed)
Pre visit review using our clinic review tool, if applicable. No additional management support is needed unless otherwise documented below in the visit note. 

## 2016-10-04 NOTE — Patient Instructions (Addendum)
For neck pain and radiating pain we gave toradol 60 mg im.  Xray of cspine.  For shoulder region and upper arm pain will get lt shoulder xray.  Rx diclofenac. Can start tomorrow.  Flexeril muscle relaxant to use at night.  norco for pain.  If pain persists from neck with radiating features then next step would be to consider mri of cervical spine.  Follow up in 7 days or as needed

## 2016-10-04 NOTE — Progress Notes (Signed)
Subjective:    Patient ID: Kimberly Carter, female    DOB: 18-Jun-1966, 51 y.o.   MRN: [id number]  HPI   Pt in with some neck pain with pain that radiates down her left shoulder, left tricep and down her arm.   No  fall or injury.   Pain started about one month ago. Pain gradually getting worse.   When pt first felt the pain was in her neck/trapezius.   Pt states at time when she puts her chin to chest the pain is eased up. When she looks upward pain is increased.  Pt states numbness to her left hand/fingers are worsening over past week.   At times she feels can't feel cup she is drinking.  Pt rt handed. No chest pain or any shortness of breath with pain. No  Back pain. No  jaw pain or sweating.  Pt taken ibuprofen but has not helped at all.(no ibuprofen today)  Pt has mirena use for years.    Review of Systems  Constitutional: Negative for chills, fatigue and fever.  Respiratory: Negative for cough, choking, chest tightness, shortness of breath and wheezing.   Cardiovascular: Negative for chest pain and palpitations.  Gastrointestinal: Negative for abdominal pain.  Musculoskeletal: Negative for back pain.       See hpi.  Neurological: Negative for dizziness and headaches.  Hematological: Negative for adenopathy. Does not bruise/bleed easily.  Psychiatric/Behavioral: Negative for behavioral problems and confusion.    Past Medical History:  Diagnosis Date  . Allergic rhinitis   . Genital herpes   . Hypertension      Social History   Social History  . Marital status: Married    Spouse name: N/A  . Number of children: N/A  . Years of education: N/A   Occupational History  . Not on file.   Social History Main Topics  . Smoking status: Current Every Day Smoker    Packs/day: 0.50    Types: Cigarettes  . Smokeless tobacco: Never Used     Comment: passive smoker  . Alcohol use Yes     Comment: occ.  . Drug use: No  . Sexual activity: Not on file   Other  Topics Concern  . Not on file   Social History Narrative  . No narrative on file    Past Surgical History:  Procedure Laterality Date  . BREAST ENHANCEMENT SURGERY    . CHOLECYSTECTOMY    . HEMORRHOID SURGERY    . MANDIBLE SURGERY      Family History  Problem Relation Age of Onset  . Hypertension Other   . Diabetes Other     Allergies  Allergen Reactions  . Amoxicillin-Pot Clavulanate     REACTION: diarrhea    Current Outpatient Prescriptions on File Prior to Visit  Medication Sig Dispense Refill  . atenolol (TENORMIN) 100 MG tablet Take 100 mg by mouth daily.     . fluticasone (FLONASE) 50 MCG/ACT nasal spray Place 2 sprays into both nostrils daily. 16 g 1  . HYDROcodone-homatropine (HYCODAN) 5-1.5 MG/5ML syrup Take 5 mLs by mouth every 8 (eight) hours as needed for cough. 120 mL 0  . levonorgestrel (MIRENA) 20 MCG/24HR IUD 1 each by Intrauterine route once.    . valACYclovir (VALTREX) 1000 MG tablet Take 500 mg by mouth daily.      No current facility-administered medications on file prior to visit.     BP 133/71 (BP Location: Right Arm, Patient Position: Sitting, Cuff Size: Normal)  Pulse 70   Temp 97.8 F (36.6 C) (Oral)   Resp 16   Ht  (1.702 m)   Wt 156 lb (70.8 kg)   SpO2 100%   BMI 24.43 kg/m       Objective:   Physical Exam   General- No acute distress. Pleasant patient. Neck- Full range of motion, no jvd. Moderate direct pain left of mid c-spine region. When pressed on this area she reported pain that radiated to left hand. Left side trapezius diffuse tender. Lungs- Clear, even and unlabored. Heart- regular rate and rhythm. Neurologic- CNII- XII grossly intact. Upper ext-Equal and symmetric grip strength. Sharp and dull discrimination intact left hand. But she does report discreased sharp sensation. Left shouder and elbow- good rom. No pain on palpation but reports upper arm pain below shoulder.      Assessment & Plan:  For neck pain  and radiating pain we gave toradol 60 mg im.  Xray of cspine.  For shoulder region and upper arm pain will get lt shoulder xray.  Rx diclofenac. Can start tomorrow.  Flexeril muscle relaxant to use at night.  norco for pain.  If pain persists from neck with radiating features then next step would be to consider mri of cervical spine.  Follow up in 7 days or as needed

## 2016-10-11 ENCOUNTER — Ambulatory Visit (INDEPENDENT_AMBULATORY_CARE_PROVIDER_SITE_OTHER): Payer: BLUE CROSS/BLUE SHIELD | Admitting: Medical

## 2016-10-11 ENCOUNTER — Encounter: Payer: Self-pay | Admitting: Medical

## 2016-10-11 VITALS — BP 119/61 | HR 57 | Temp 97.9°F | Resp 16 | Ht 67.0 in | Wt 158.8 lb

## 2016-10-11 DIAGNOSIS — M542 Cervicalgia: Secondary | ICD-10-CM | POA: Diagnosis not present

## 2016-10-11 DIAGNOSIS — M5410 Radiculopathy, site unspecified: Secondary | ICD-10-CM | POA: Diagnosis not present

## 2016-10-11 MED ORDER — CYCLOBENZAPRINE HCL 10 MG PO TABS: 10.0000 mg | ORAL_TABLET | Freq: Every day | ORAL | 0 refills | Status: DC

## 2016-10-11 MED ORDER — DICLOFENAC SODIUM 75 MG PO TBEC: 75.0000 mg | DELAYED_RELEASE_TABLET | Freq: Two times a day (BID) | ORAL | 0 refills | Status: DC

## 2016-10-11 MED ORDER — FLUTICASONE PROPIONATE 50 MCG/ACT NA SUSP: 2.0000 | Freq: Every day | NASAL | 5 refills | Status: DC

## 2016-10-11 NOTE — Progress Notes (Signed)
Pre visit review using our clinic review tool, if applicable. No additional management support is needed unless otherwise documented below in the visit note. 

## 2016-10-11 NOTE — Progress Notes (Signed)
Subjective:    Patient ID: Kimberly Carter, female    DOB: [birth year], 51 y.o.   MRN: [id number]  HPI  Pt in for follow up. Pt states neck pain with radiating features  did feel better. 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  Pain presently at rest 5/10.  Xray done showed mild djd. Possible foramen narrowing both sides.  When she works will feel tingling sensation to first 3 digits left side.   Pt took nsaids and muscle relaxant I wrote.   Still has norco.  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  Pt minimizes her depression screen scores. Marland Kitchen LMP- 2 weeks. Also states major stress relating to dad being in hospital. Also perimenopause per pt. States zoloft.   Review of Systems  Constitutional: Negative for chills, fatigue and fever.  Respiratory: Negative for cough, chest tightness, shortness of breath and wheezing.   Cardiovascular: Negative for chest pain and palpitations.  Musculoskeletal:       Left side neck pain.  Skin: Negative for rash.  Neurological: Negative for dizziness and light-headedness.       Radicular pain.  Hematological: Negative for adenopathy.  Psychiatric/Behavioral: Positive for dysphoric mood. Negative for behavioral problems, confusion, hallucinations, self-injury and suicidal ideas. The patient is not nervous/anxious and is not hyperactive.        See hpi.   Past Medical History:  Diagnosis Date  . Allergic rhinitis   . Genital herpes   . Hypertension      Social History   Social History  . Marital status: Married    Spouse name: N/A  . Number of children: N/A  . Years of education: N/A   Occupational History  . Not on file.   Social History Main Topics  . Smoking status: Current Every Day Smoker    Packs/day: 0.50    Types: Cigarettes  . Smokeless tobacco: Never Used     Comment: passive smoker  . Alcohol use Yes     Comment: occ.  . Drug use: No  . Sexual activity: Not  on file   Other Topics Concern  . Not on file   Social History Narrative  . No narrative on file    Past Surgical History:  Procedure Laterality Date  . BREAST ENHANCEMENT SURGERY    . CHOLECYSTECTOMY    . HEMORRHOID SURGERY    . MANDIBLE SURGERY      Family History  Problem Relation Age of Onset  . Hypertension Other   . Diabetes Other     Allergies  Allergen Reactions  . Amoxicillin-Pot Clavulanate     REACTION: diarrhea    Current Outpatient Prescriptions on File Prior to Visit  Medication Sig Dispense Refill  . atenolol (TENORMIN) 100 MG tablet Take 100 mg by mouth daily.     . cyclobenzaprine (FLEXERIL) 10 MG tablet Take 1 tablet (10 mg total) by mouth at bedtime. 30 tablet 0  . diclofenac (VOLTAREN) 75 MG EC tablet Take 1 tablet (75 mg total) by mouth 2 (two) times daily. 30 tablet 0  . fluticasone (FLONASE) 50 MCG/ACT nasal spray Place 2 sprays into both nostrils daily. 16 g 1  . HYDROcodone-acetaminophen (NORCO) 5-325 MG tablet Take 1 tablet by mouth every 6 (six) hours as needed for moderate pain. 16 tablet 0  . levonorgestrel (MIRENA) 20 MCG/24HR IUD 1 each by Intrauterine route once.    . metoprolol succinate (TOPROL-XL) 100 MG  24 hr tablet Take 100 mg by mouth daily.  0  . sertraline (ZOLOFT) 100 MG tablet Take 100 mg by mouth daily.  1  . valACYclovir (VALTREX) 1000 MG tablet Take 500 mg by mouth daily.      No current facility-administered medications on file prior to visit.     BP 119/61 (BP Location: Right Arm, Patient Position: Sitting, Cuff Size: Normal)   Pulse (!) 57   Temp 97.9 F (36.6 C) (Oral)   Resp 16   Ht  (1.702 m)   Wt 158 lb 12.8 oz (72 kg)   SpO2 97%   BMI 24.87 kg/m       Objective:   Physical Exam   General- No acute distress. Pleasant patient. Neck- Full range of motion, no jvd. No mid cervical spine pain today. Lungs- Clear, even and unlabored. Heart- regular rate and rhythm. Neurologic- CNII- XII grossly  intact. Lt upper ext- good rom, good strengthnand sensation. But after movement notes tingle thumb, second digit and 3rd digit.      Assessment & Plan:  For your neck pain with pain shooting toward arm and tingling will refer you to sport medicine.   Refiing your diclofenac and muscle relaxant.  Will see if you sport medicine MD thinks PT beneficial or maybe mri.  Refilling your flonase for allergies.  Regarding your mood if you want to increase sertraline to 200 mg a day let us know and could write that rx.  Follow up in as needed before or after sports med.  Alem Fahl, Ramon Dredge, PA-C

## 2016-10-11 NOTE — Patient Instructions (Addendum)
For your neck pain with pain shooting toward arm and tingling will refer you to sport medicine.   Refiing your diclofenac and muscle relaxant.  Will see if you sport medicine MD thinks PT beneficial or maybe mri.  Refilling your flonase for allergies.  Regarding your mood if you want to increase sertraline to 200 mg a day let us know and could write that rx.  Follow up in as needed before or after sports med.

## 2016-12-06 ENCOUNTER — Ambulatory Visit (HOSPITAL_COMMUNITY)
Admission: RE | Admit: 2016-12-06 | Discharge: 2016-12-06 | Disposition: A | Discharge status: Home / Self Care | Payer: Self-pay | Source: Ambulatory Visit | Admitting: Radiology

## 2016-12-07 ENCOUNTER — Other Ambulatory Visit: Payer: Self-pay | Admitting: Obstetrics & Gynecology

## 2016-12-07 DIAGNOSIS — R928 Other abnormal and inconclusive findings on diagnostic imaging of breast: Secondary | ICD-10-CM

## 2016-12-12 ENCOUNTER — Ambulatory Visit
Admission: RE | Admit: 2016-12-12 | Discharge: 2016-12-12 | Disposition: A | Discharge status: Home / Self Care | Payer: No Typology Code available for payment source | Source: Ambulatory Visit | Attending: Obstetrics & Gynecology | Admitting: Obstetrics & Gynecology

## 2016-12-12 ENCOUNTER — Ambulatory Visit (HOSPITAL_COMMUNITY)
Admission: RE | Admit: 2016-12-12 | Discharge: 2016-12-12 | Disposition: A | Discharge status: Home / Self Care | Payer: Self-pay | Source: Ambulatory Visit | Admitting: Radiology

## 2016-12-12 DIAGNOSIS — R928 Other abnormal and inconclusive findings on diagnostic imaging of breast: Secondary | ICD-10-CM

## 2017-11-06 ENCOUNTER — Encounter: Payer: Self-pay | Admitting: Medical

## 2017-11-06 ENCOUNTER — Ambulatory Visit (HOSPITAL_BASED_OUTPATIENT_CLINIC_OR_DEPARTMENT_OTHER)
Admission: RE | Admit: 2017-11-06 | Discharge: 2017-11-06 | Disposition: A | Discharge status: Home / Self Care | Payer: Self-pay | Source: Ambulatory Visit | Attending: Medical | Admitting: Medical

## 2017-11-06 ENCOUNTER — Ambulatory Visit (INDEPENDENT_AMBULATORY_CARE_PROVIDER_SITE_OTHER): Payer: Self-pay | Admitting: Medical

## 2017-11-06 VITALS — BP 129/71 | HR 65 | Temp 99.2°F | Resp 16 | Ht 67.0 in | Wt 159.8 lb

## 2017-11-06 DIAGNOSIS — M79645 Pain in left finger(s): Secondary | ICD-10-CM

## 2017-11-06 DIAGNOSIS — M79642 Pain in left hand: Secondary | ICD-10-CM

## 2017-11-06 MED ORDER — DICLOFENAC SODIUM 75 MG PO TBEC: 75.0000 mg | DELAYED_RELEASE_TABLET | Freq: Two times a day (BID) | ORAL | 0 refills | Status: AC

## 2017-11-06 MED ORDER — CYCLOBENZAPRINE HCL 5 MG PO TABS: 5.0000 mg | ORAL_TABLET | Freq: Every day | ORAL | 0 refills | Status: AC

## 2017-11-06 NOTE — Patient Instructions (Addendum)
For pain in thumb and hand, I will prescribe you diclofenac prescription.  For neck soreness/trapezius pain rx flexeril to use at night.  For hand pain and thumb pain will get xray.   If no fracture seen then recommend that you get thumb spica splint to use daily. If fx then refer you to sports med or ortho.  If no fx seen then still recommend follow up in 7 days. Might need more imaging if thumb pain persists. Or otho referral.

## 2017-11-06 NOTE — Progress Notes (Signed)
Subjective:    Patient ID: Kimberly Carter, female    DOB: 1965/07/09, 52 y.o.   MRN: [id number]  HPI  Pt in with left hand pain. She at bowling [place] yesterday. She feel over short metal rack at bowling alley.   Her left hand hurts the worse. But she describes diffuse achinness all over.   Pain is mostly in her thumb area.Some pain 1st metacarpal area.  Faint soreness to rt knee, left shin and left elbow.    Review of Systems  Constitutional: Negative for chills, fatigue and fever.  Gastrointestinal: Negative for abdominal pain.  Musculoskeletal:       Left hand and thumb pain. Mostly in thumb.  Skin: Negative for rash.  Hematological: Negative for adenopathy. Does not bruise/bleed easily.  Psychiatric/Behavioral: Negative for behavioral problems and confusion.    Past Medical History:  Diagnosis Date  . Allergic rhinitis   . Genital herpes   . Hypertension      Social History   Socioeconomic History  . Marital status: Married    Spouse name: Not on file  . Number of children: Not on file  . Years of education: Not on file  . Highest education level: Not on file  Occupational History  . Not on file  Social Needs  . Financial resource strain: Not on file  . Food insecurity:    Worry: Not on file    Inability: Not on file  . Transportation needs:    Medical: Not on file    Non-medical: Not on file  Tobacco Use  . Smoking status: Current Every Day Smoker    Packs/day: 0.50    Types: Cigarettes  . Smokeless tobacco: Never Used  . Tobacco comment: passive smoker  Substance and Sexual Activity  . Alcohol use: Yes    Comment: occ.  . Drug use: No  . Sexual activity: Not on file  Lifestyle  . Physical activity:    Days per week: Not on file    Minutes per session: Not on file  . Stress: Not on file  Relationships  . Social connections:    Talks on phone: Not on file    Gets together: Not on file    Attends religious service: Not on file    Active  member of club or organization: Not on file    Attends meetings of clubs or organizations: Not on file    Relationship status: Not on file  . Intimate partner violence:    Fear of current or ex partner: Not on file    Emotionally abused: Not on file    Physically abused: Not on file    Forced sexual activity: Not on file  Other Topics Concern  . Not on file  Social History Narrative  . Not on file    Past Surgical History:  Procedure Laterality Date  . BREAST ENHANCEMENT SURGERY    . CHOLECYSTECTOMY    . HEMORRHOID SURGERY    . MANDIBLE SURGERY      Family History  Problem Relation Age of Onset  . Hypertension Other   . Diabetes Other     Allergies  Allergen Reactions  . Amoxicillin-Pot Clavulanate     REACTION: diarrhea    Current Outpatient Medications on File Prior to Visit  Medication Sig Dispense Refill  . levonorgestrel (MIRENA) 20 MCG/24HR IUD 1 each by Intrauterine route once.    . metoprolol succinate (TOPROL-XL) 100 MG 24 hr tablet Take 100 mg by mouth  daily.  0  . sertraline (ZOLOFT) 100 MG tablet Take 100 mg by mouth daily.  1  . valACYclovir (VALTREX) 1000 MG tablet Take 500 mg by mouth daily.      No current facility-administered medications on file prior to visit.     BP 129/71   Pulse 65   Temp 99.2 F (37.3 C) (Oral)   Resp 16   Ht  (1.702 m)   Wt 159 lb 12.8 oz (72.5 kg)   SpO2 98%   BMI 25.03 kg/m       Objective:   Physical Exam  General- No acute distress. Pleasant patient. Neck- Full range of motion, no jvd. Mild trapezious tender rt side. No mid spine pain. Lungs- Clear, even and unlabored. Heart- regular rate and rhythm. Neurologic- CNII- XII grossly intact.  Left hand- faint 2nd metacarpal tenderness. Left thumb- pain on palpation. But most of pain at base. Left wrist- no pain on palpation.  Rt knee- no bruising. Good rom. No significant pain on rom. Left shin- no tenderness but faint abrasion. Left elbow- no pain  on palpation.       Assessment & Plan:  For pain in thumb and hand, I will prescribe you diclofenac prescription.  For neck soreness/trapezius pain rx flexeril to use at night.  For hand pain and thumb pain will get xray.   If no fracture seen then recommend that you get thumb spica splint to use daily. If fx then refer you to sports med or ortho.  If no fx seen then still recommend follow up in 7 days. Might need more imaging if thumb pain persists.  Radiology technologist did call me and indicated that they thought dedicated view of thumb was not necessary.  I just expressed to them that they can do the hand as long as the use of the thumb are included(mention that I wanted to be able to the scaphoid).  If that could be included in the hand then that is fine.

## 2017-11-07 ENCOUNTER — Telehealth: Payer: Self-pay | Admitting: *Deleted

## 2017-11-07 NOTE — Telephone Encounter (Signed)
Copied from CRM [id number]. Topic: General - Other >> Nov 06, 2017  5:13 PM Debroah Loop wrote: Reason for CRM: Patient calling for imaging results and thinks she missed a call from the office.

## 2017-11-07 NOTE — Telephone Encounter (Signed)
Looks like edward spoke with patient.  Called by accident did not leave message if patient calls back

## 2018-03-10 ENCOUNTER — Emergency Department (HOSPITAL_COMMUNITY): Payer: Self-pay | Admitting: Emergency Medicine

## 2018-09-04 IMAGING — DX DG HAND COMPLETE 3+V*L*
4 series · 4 of 4 positions shown · non-contrast
Comparison: 11/03/2015

CLINICAL DATA: Fall yesterday with left hand pain, initial
encounter

EXAM:
LEFT HAND - COMPLETE 3+ VIEW

[hand pa]
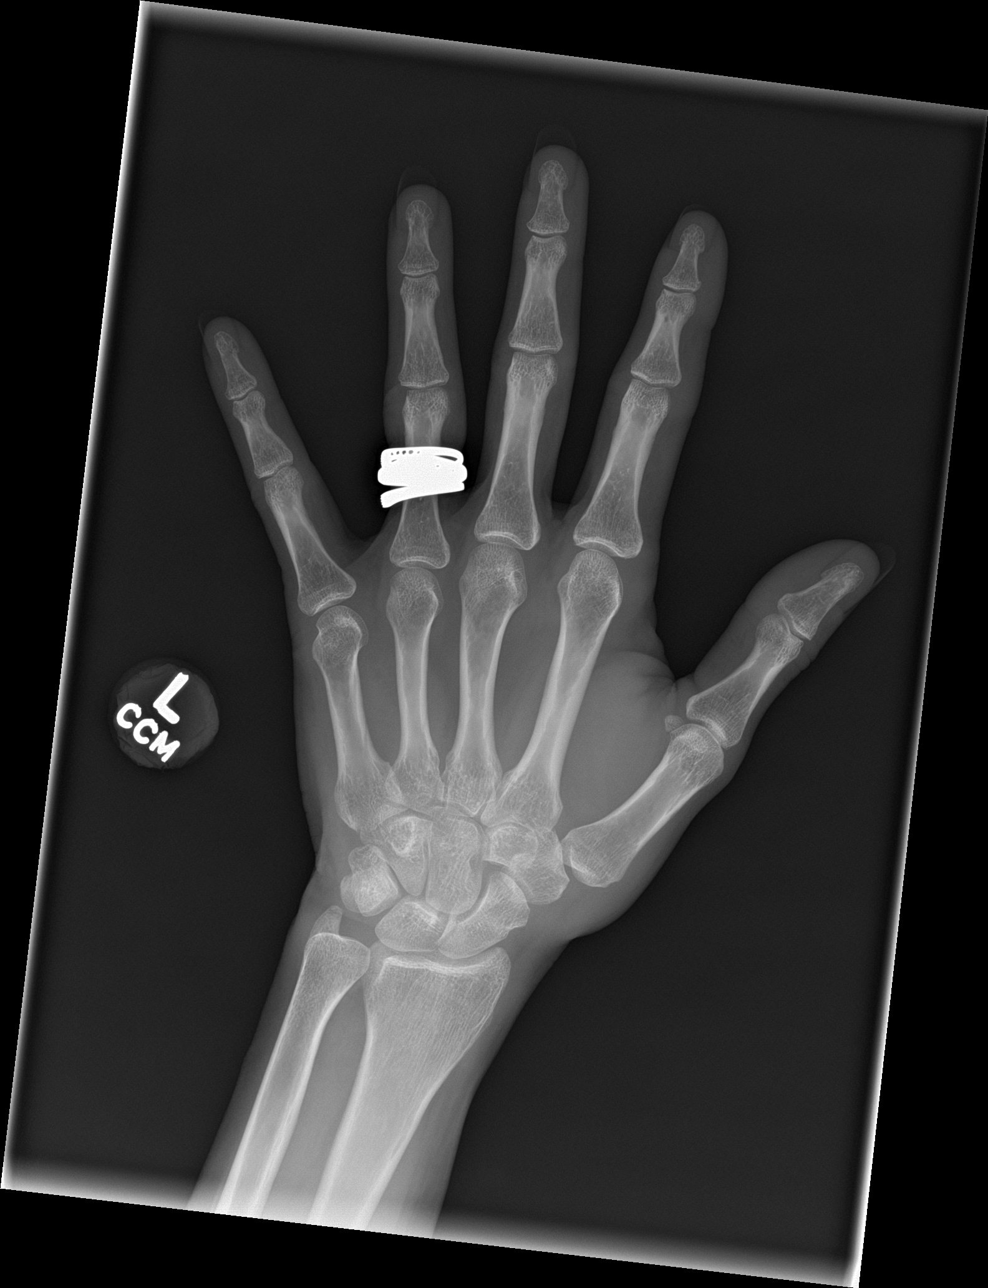

[hand obl]
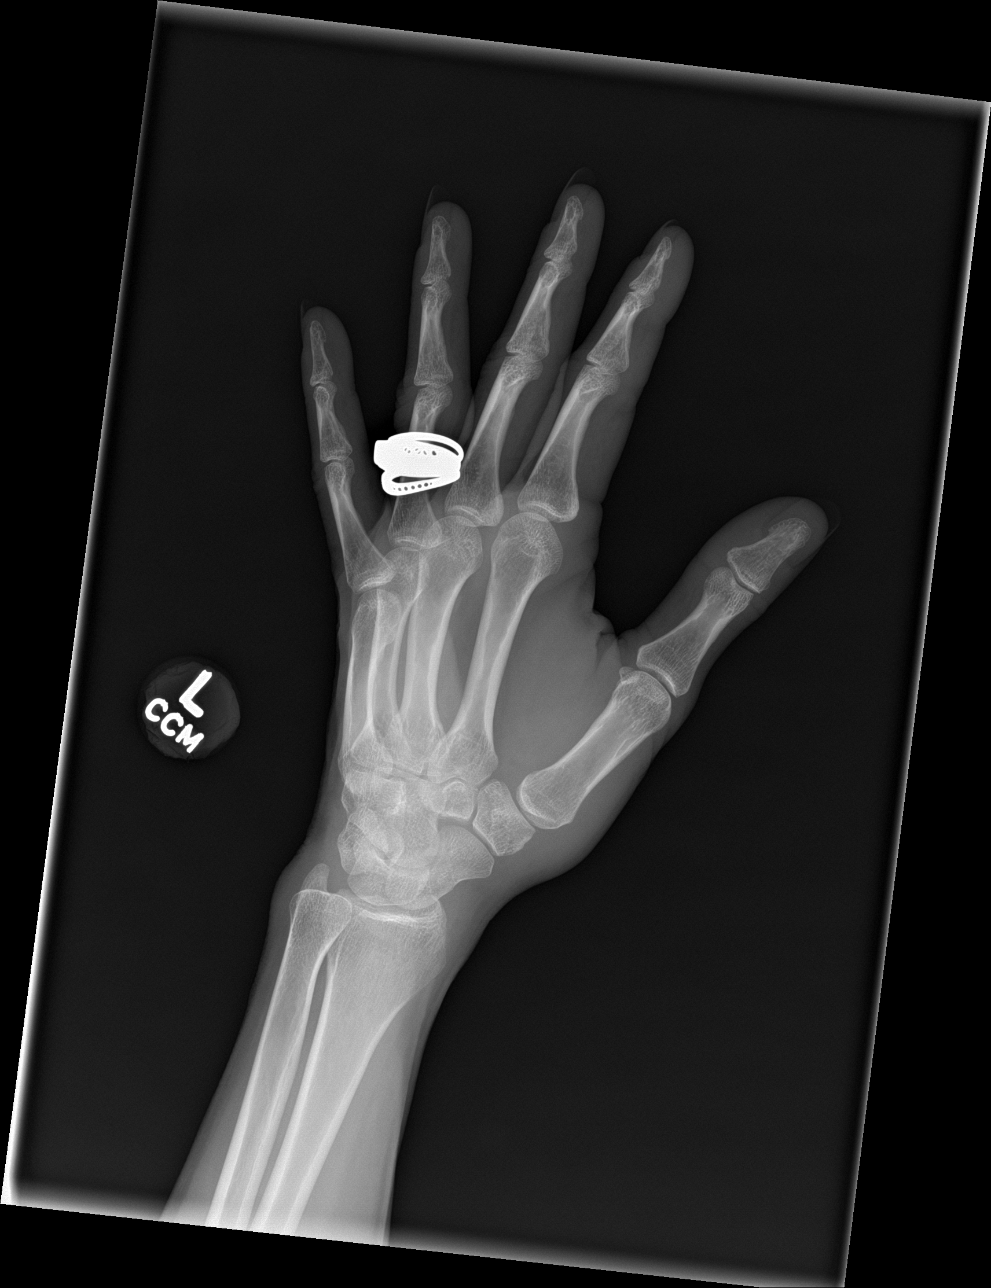

[hand lat]
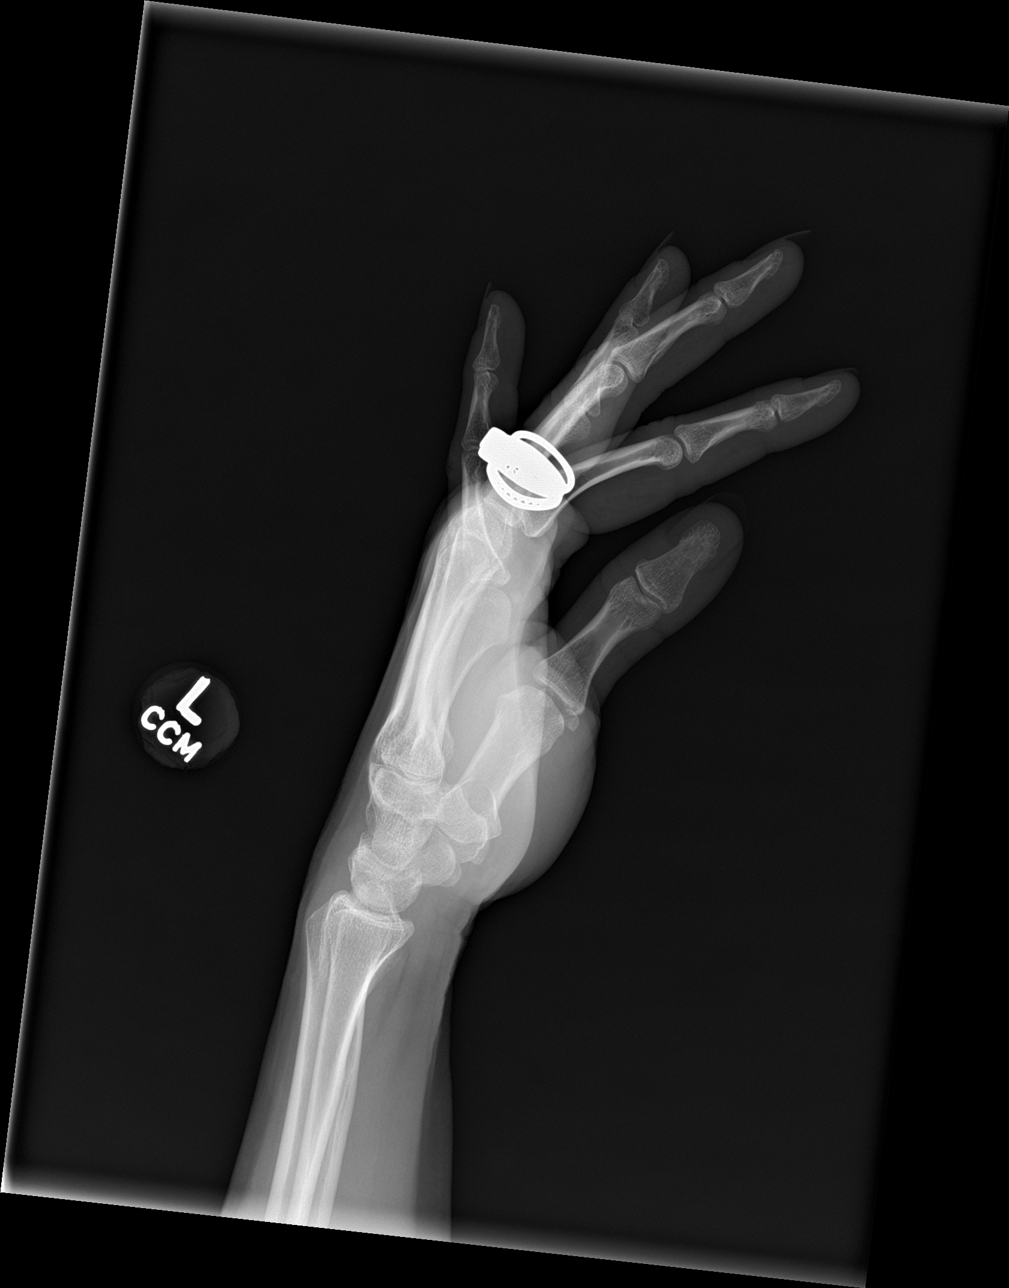

[finger lat]
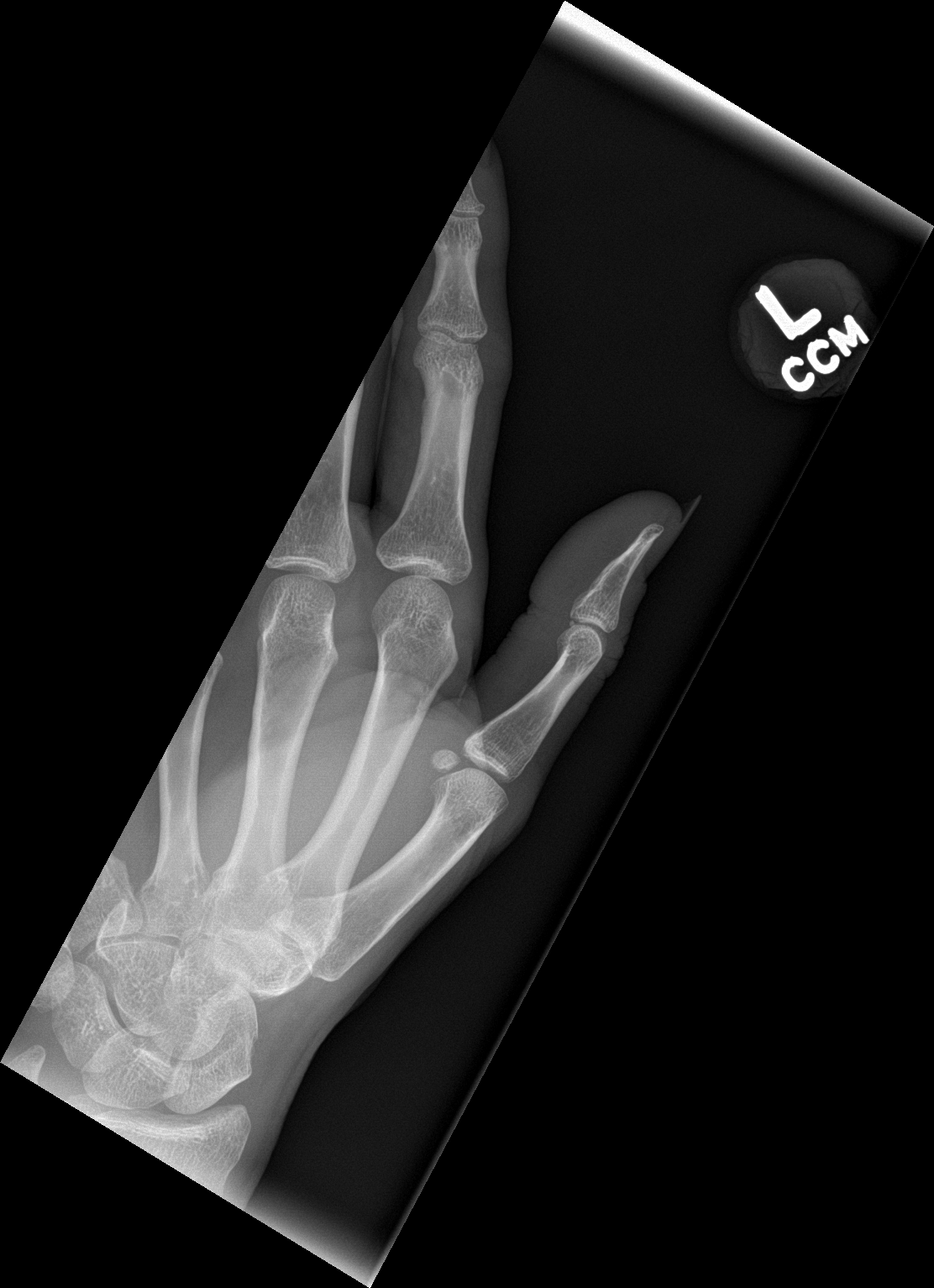

[4 of 4 positions shown; findings below may reference images not displayed]

FINDINGS: There is no evidence of fracture or dislocation. There is no
evidence of arthropathy or other focal bone abnormality. Soft
tissues are unremarkable.
IMPRESSION: No acute abnormality noted.

## 2020-03-03 ENCOUNTER — Ambulatory Visit (HOSPITAL_COMMUNITY)
Admission: RE | Admit: 2020-03-03 | Discharge: 2020-03-03 | Disposition: A | Discharge status: Home / Self Care | Payer: Self-pay | Source: Ambulatory Visit | Admitting: Radiology

## 2021-10-25 ENCOUNTER — Ambulatory Visit (INDEPENDENT_AMBULATORY_CARE_PROVIDER_SITE_OTHER): Payer: MEDICAID | Admitting: OTOLARYNGOLOGY

## 2021-10-25 ENCOUNTER — Other Ambulatory Visit: Payer: Self-pay

## 2021-10-25 ENCOUNTER — Encounter (INDEPENDENT_AMBULATORY_CARE_PROVIDER_SITE_OTHER): Payer: Self-pay | Admitting: OTOLARYNGOLOGY

## 2021-10-25 VITALS — Ht 67.0 in | Wt 180.0 lb

## 2021-10-25 DIAGNOSIS — N3281 Overactive bladder: Secondary | ICD-10-CM | POA: Insufficient documentation

## 2021-10-25 DIAGNOSIS — J309 Allergic rhinitis, unspecified: Secondary | ICD-10-CM

## 2021-10-25 DIAGNOSIS — I10 Essential (primary) hypertension: Secondary | ICD-10-CM | POA: Insufficient documentation

## 2021-10-25 DIAGNOSIS — J3489 Other specified disorders of nose and nasal sinuses: Secondary | ICD-10-CM

## 2021-10-25 DIAGNOSIS — R609 Edema, unspecified: Secondary | ICD-10-CM | POA: Insufficient documentation

## 2021-10-25 DIAGNOSIS — B009 Herpesviral infection, unspecified: Secondary | ICD-10-CM | POA: Insufficient documentation

## 2021-10-25 DIAGNOSIS — L259 Unspecified contact dermatitis, unspecified cause: Secondary | ICD-10-CM | POA: Insufficient documentation

## 2021-10-25 DIAGNOSIS — E663 Overweight: Secondary | ICD-10-CM | POA: Insufficient documentation

## 2021-10-25 DIAGNOSIS — F419 Anxiety disorder, unspecified: Secondary | ICD-10-CM | POA: Insufficient documentation

## 2021-10-25 DIAGNOSIS — N951 Menopausal and female climacteric states: Secondary | ICD-10-CM | POA: Insufficient documentation

## 2021-10-25 DIAGNOSIS — F17200 Nicotine dependence, unspecified, uncomplicated: Secondary | ICD-10-CM | POA: Insufficient documentation

## 2021-10-25 DIAGNOSIS — N879 Dysplasia of cervix uteri, unspecified: Secondary | ICD-10-CM | POA: Insufficient documentation

## 2021-10-25 MED ORDER — MUPIROCIN 2 % TOPICAL OINTMENT
TOPICAL_OINTMENT | Freq: Two times a day (BID) | CUTANEOUS | 1 refills | Status: AC
Start: 2021-10-25 — End: ?

## 2021-10-25 NOTE — Procedures (Signed)
[facility]  [address]  [place] [address]    Procedure Note    Name: Ashley Moreno MRN:  [id number]   Date: 10/25/2021 Age: 56 y.o.       [address]31 - NASAL ENDOSCOPY DIAGNOSTIC UNILATERAL OR BILATERAL (AMB ONLY)  Performed by: Conchita Paris, DO  Authorized by: Conchita Paris, DO     Time Out:     Immediately before the procedure, a time out was called:  Yes    Patient verified:  Yes    Procedure Verified:  Yes    Site Verified:  Yes  Documentation:      Indications for procedure: Obstructive nasal breathing    Anesthesia: Oxymetazoline nasal spray    Description: Nasal endoscopy with rigid scope was performed with examination of the  septum, inferior, middle, and superior meatus, turbinates, sphenoethmoidal recess, and nasopharynx.     There were no polyps, pus, or granulation tissue noted.  ET orifices and nasopharynx were normal.     Findings: Septal deviation, diffuse erythema    The patient tolerated the procedure well.              Conchita Paris, DO

## 2021-10-25 NOTE — H&P (Signed)
[facility]  [address]  [place] [address]  Phone: [phone]  Fax: [phone]      Encounter Date: 10/25/2021    Patient ID: Ashley Moreno  MRN: [id number]    DOB: July 22, 1965  Age: 56 y.o. female        Referring Provider:    Vernell Barrier, PA-C  [address]  [place],  [place] [address]    Reason for Visit:   Chief Complaint   Patient presents with   . Sinus Problem     Complains of nasal lesion in left nostril. States present for the past 2 years.        History of Present Illness:  Ashley Moreno is a 56 y.o. female who is referred for eval of nose. She c/o recurring scab in the left nostril x 2 years. Bleeds when picked at it. It usually only shows up in the winter but is still present. She c/o allergies and significant drainage. She has tried neosporin and vaseline without relief.       Patient History:  Patient Active Problem List   Diagnosis   . Overactive bladder   . Anxiety disorder   . Body fluid retention   . Cervical intraepithelial neoplasia   . Contact dermatitis   . Herpes simplex   . Benign essential hypertension   . Menopausal syndrome   . Overweight with body mass index (BMI) 25.0-29.9   . Smoker     Current Outpatient Medications   Medication Sig   . amLODIPine (NORVASC) 5 mg Oral Tablet    . losartan (COZAAR) 100 mg Oral Tablet Take 1 Tablet (100 mg total) by mouth Once a day   . metoprolol succinate (TOPROL-XL) 100 mg Oral Tablet Sustained Release 24 hr metoprolol succinate ER 100 mg tablet,extended release 24 hr   TAKE 1 TABLET BY MOUTH EVERY DAY   . mupirocin (BACTROBAN) 2 % Ointment Apply topically Twice daily   . sertraline (ZOLOFT) 100 mg Oral Tablet sertraline 100 mg tablet   TAKE 1/2 TABLET BY MOUTH EVERY MORNING AND 1 TABLET BY MOUTH EVERY NIGHT AT BEDTIME   . valACYclovir (VALTREX) 500 mg Oral Tablet valacyclovir 500 mg tablet   TAKE 1 TABLET BY MOUTH EVERY DAY     Allergies   Allergen Reactions   . Venom-Honey Bee Rash   . Amoxicillin      Past Medical  History:   Diagnosis Date   . Essential hypertension       Past Surgical History:   Procedure Laterality Date   . LAPAROSCOPIC CHOLECYSTECTOMY        Family Medical History:    None         Social History     Tobacco Use   . Smoking status: Every Day     Types: Cigarettes   . Smokeless tobacco: Never       Review of Systems:  Review of Systems    Physical Exam:  Ht 1.702 m (5\' 7" )   Wt 81.6 kg (180 lb)   BMI 28.19 kg/m       Physical Exam  Constitutional:       Appearance: Normal appearance. She is well-developed, well-groomed and normal weight.   HENT:      Head: Normocephalic and atraumatic.      Right Ear: Hearing, tympanic membrane, ear canal and external ear normal.      Left Ear: Hearing, tympanic membrane, ear canal and external ear normal.  Nose: Septal deviation and mucosal edema present.      Right Turbinates: Enlarged.      Left Turbinates: Enlarged.      Comments: L inferior nare with scab      Mouth/Throat:      Lips: Pink.      Mouth: Mucous membranes are moist.      Pharynx: Oropharynx is clear. Uvula midline.   Eyes:      Extraocular Movements: Extraocular movements intact.   Neck:      Trachea: Phonation normal.   Pulmonary:      Effort: Pulmonary effort is normal.   Musculoskeletal:      Cervical back: Normal range of motion and neck supple.   Lymphadenopathy:      Cervical: No cervical adenopathy.   Skin:     General: Skin is warm.   Neurological:      Mental Status: She is alert and oriented to person, place, and time.      Cranial Nerves: Cranial nerves 2-12 are intact. No facial asymmetry.   Psychiatric:         Attention and Perception: Attention normal.         Mood and Affect: Mood normal.         Speech: Speech normal.         Behavior: Behavior normal. Behavior is cooperative.         Assessment:  ENCOUNTER DIAGNOSES     ICD-10-CM   1. Allergic rhinitis  J30.9   2. Nasal vestibulitis  J34.89       Plan:  Medical records reviewed on 10/25/2021.  Rx mupirocin.   Orders Placed This  Encounter   . 97026 - NASAL ENDOSCOPY DIAGNOSTIC UNILATERAL OR BILATERAL (AMB ONLY)   . mupirocin (BACTROBAN) 2 % Ointment     Return in about 6 weeks (around 12/06/2021).    Marcelline Deist, PA-C  10/25/2021, 13:30   The advanced practice clinician's documentation was reviewed/amended in its entirety with the assessment and plan portion completely performed independently by me during this separate encounter.

## 2021-12-06 ENCOUNTER — Encounter (INDEPENDENT_AMBULATORY_CARE_PROVIDER_SITE_OTHER): Payer: Self-pay | Admitting: OTOLARYNGOLOGY

## 2021-12-20 ENCOUNTER — Encounter (INDEPENDENT_AMBULATORY_CARE_PROVIDER_SITE_OTHER): Payer: Self-pay | Admitting: OTOLARYNGOLOGY

## 2022-01-31 ENCOUNTER — Other Ambulatory Visit: Payer: Self-pay

## 2022-01-31 ENCOUNTER — Other Ambulatory Visit (HOSPITAL_COMMUNITY): Payer: Self-pay | Admitting: PHYSICIAN ASSISTANT

## 2022-01-31 DIAGNOSIS — Z1239 Encounter for other screening for malignant neoplasm of breast: Secondary | ICD-10-CM

## 2022-02-25 ENCOUNTER — Other Ambulatory Visit (HOSPITAL_COMMUNITY): Payer: Self-pay | Admitting: PHYSICIAN ASSISTANT

## 2022-02-25 ENCOUNTER — Inpatient Hospital Stay
Admission: RE | Admit: 2022-02-25 | Discharge: 2022-02-25 | Disposition: A | Discharge status: Home / Self Care | Payer: MEDICAID | Source: Ambulatory Visit | Attending: PHYSICIAN ASSISTANT | Admitting: PHYSICIAN ASSISTANT

## 2022-02-25 ENCOUNTER — Encounter (HOSPITAL_BASED_OUTPATIENT_CLINIC_OR_DEPARTMENT_OTHER): Payer: Self-pay

## 2022-02-25 ENCOUNTER — Other Ambulatory Visit: Payer: Self-pay

## 2022-02-25 DIAGNOSIS — Z1231 Encounter for screening mammogram for malignant neoplasm of breast: Secondary | ICD-10-CM | POA: Insufficient documentation

## 2022-03-08 ENCOUNTER — Other Ambulatory Visit (HOSPITAL_COMMUNITY): Payer: Self-pay

## 2022-03-08 DIAGNOSIS — Z1231 Encounter for screening mammogram for malignant neoplasm of breast: Secondary | ICD-10-CM

## 2022-07-17 ENCOUNTER — Inpatient Hospital Stay (HOSPITAL_COMMUNITY): Payer: MEDICAID

## 2022-07-17 ENCOUNTER — Emergency Department
Admission: EM | Admit: 2022-07-17 | Discharge: 2022-07-17 | Disposition: A | Discharge status: Home / Self Care | Payer: MEDICAID | Attending: PHYSICIAN ASSISTANT | Admitting: PHYSICIAN ASSISTANT

## 2022-07-17 ENCOUNTER — Encounter (HOSPITAL_COMMUNITY): Payer: Self-pay | Admitting: PHYSICIAN ASSISTANT

## 2022-07-17 ENCOUNTER — Other Ambulatory Visit: Payer: Self-pay

## 2022-07-17 DIAGNOSIS — J9811 Atelectasis: Secondary | ICD-10-CM | POA: Insufficient documentation

## 2022-07-17 DIAGNOSIS — F17210 Nicotine dependence, cigarettes, uncomplicated: Secondary | ICD-10-CM

## 2022-07-17 DIAGNOSIS — J40 Bronchitis, not specified as acute or chronic: Secondary | ICD-10-CM

## 2022-07-17 MED ORDER — BENZONATATE 100 MG CAPSULE
200.0000 mg | ORAL_CAPSULE | Freq: Three times a day (TID) | ORAL | 0 refills | Status: AC | PRN
Start: 2022-07-17 — End: 2022-07-27

## 2022-07-17 MED ORDER — ALBUTEROL SULFATE HFA 90 MCG/ACTUATION AEROSOL INHALER
1.0000 | INHALATION_SPRAY | Freq: Four times a day (QID) | RESPIRATORY_TRACT | 0 refills | Status: AC | PRN
Start: 2022-07-17 — End: ?

## 2022-07-17 NOTE — ED Triage Notes (Signed)
Pt states she was diagnosed with the flu on Wednesday and was given prednisone. Pt states she is only getting worse and is now having chest wall pain but endorses coughing.

## 2022-07-17 NOTE — ED Provider Notes (Signed)
[facility]  ED Primary Provider Note  History of Present Illness   Chief Complaint   Patient presents with    Flu Like Symptoms     Ashley Moreno is a 57 y.o. female who had concerns including Flu Like Symptoms.  Arrival: The patient arrived by Car    57 year old female presents ambulatory to the ED complaining of flu-like symptoms.  Patient was diagnosed flu positive at [facility] on Wednesday.  She was given a steroid shot Medrol Dosepak and Tamiflu.  Patient states that the pharmacy was out of the Tamiflu and she has not picked that up.  Patient is now outside of the window to be started on Tamiflu.  Patient states she feels like she has been getting worse.  She continues to have a dry nonproductive cough.  She feels like the cough is getting worse.  She is now having soreness in her chest from coughing so hard.  She reports a burning in her chest when she takes a deep breath which also makes her cough worse.  Denies any nausea or vomiting.  Patient does smoke but states she has not smoke since she has been sick.      History Reviewed This Encounter: Medical History  Surgical History  Family History  Social History    Physical Exam   ED Triage Vitals [07/17/22 0436]   BP (Non-Invasive) 137/88   Heart Rate 90   Respiratory Rate 20   Temperature 37.4 C (99.3 F)   SpO2 94 %   Weight 83.9 kg (185 lb)   Height 1.702 m (5\' 7" )     Physical Exam  Vitals reviewed.   Constitutional:       General: She is not in acute distress.     Appearance: Normal appearance. She is well-developed.   HENT:      Head: Normocephalic and atraumatic.   Cardiovascular:      Rate and Rhythm: Normal rate and regular rhythm.      Heart sounds: No murmur heard.  Pulmonary:      Effort: Pulmonary effort is normal. No respiratory distress.      Breath sounds: Wheezing present.   Abdominal:      Palpations: Abdomen is soft.      Tenderness: There is no abdominal tenderness.   Musculoskeletal:         General:  No swelling.   Skin:     General: Skin is warm and dry.      Capillary Refill: Capillary refill takes less than 2 seconds.   Neurological:      Mental Status: She is alert.   Psychiatric:         Mood and Affect: Mood normal.       Patient Data   Labs Ordered/Reviewed - No data to display  XR CHEST PA AND LATERAL   Final Result by Edi, Radresults In (01/28 0654)   MINIMAL LEFT BASILAR ATELECTASIS         Radiologist location ID: [place] Decision Making        Medical Decision Making  Patient is known flu positive.  She presented for worsening cough and shortness a breath.  Chest x-ray was ordered for further evaluation.  Chest x-ray shows some mild atelectasis but no pneumonia.  I feel patient was likely developed bronchitis secondary to the flu and her tobacco use.  She will be prescribed Tessalon  Perles and albuterol inhaler.  She was advised to continue on the steroids.  Patient is outside the window for Tamiflu.    Amount and/or Complexity of Data Reviewed  Radiology: ordered.    Risk  Prescription drug management.                Clinical Impression   Bronchitis (Primary)       Disposition: Discharged

## 2022-07-17 NOTE — Discharge Instructions (Signed)
Drink plenty of fluids. Continue any at home medications as previously prescribed. Take medications that are prescribed from today's visit as prescribed. Discuss any questions you may have concerning your medications with your pharmacist. Be sure to cover your coughs and sneezes. Wash your hands or use hand sanitizer frequently. Alternate doses of Tylenol and Motrin as needed for any fever greater than 100.4 or for aches and pains. Follow up with your regular PCP in the next 2-3 days. Return to the ED if symptoms worsen, change, or do not improve.

## 2022-07-17 NOTE — ED Nurses Note (Signed)
Patient discharged home with family. AVS reviewed with patient. A written copy of the AVS and discharge instructions were given to the patient. Questions sufficiently answered as needed. Patient encouraged to follow up with PCP as indicated. In the event of an emergency, patient instructed to call 911 or go to the nearest emergency room.

## 2022-07-21 ENCOUNTER — Other Ambulatory Visit: Payer: Self-pay

## 2022-07-21 ENCOUNTER — Encounter (HOSPITAL_COMMUNITY): Payer: Self-pay

## 2022-07-21 ENCOUNTER — Emergency Department
Admission: EM | Admit: 2022-07-21 | Discharge: 2022-07-21 | Disposition: A | Discharge status: Home / Self Care | Payer: Self-pay | Attending: Emergency Medicine | Admitting: Emergency Medicine

## 2022-07-21 ENCOUNTER — Emergency Department (HOSPITAL_COMMUNITY): Payer: Self-pay

## 2022-07-21 DIAGNOSIS — R059 Cough, unspecified: Secondary | ICD-10-CM

## 2022-07-21 DIAGNOSIS — J40 Bronchitis, not specified as acute or chronic: Secondary | ICD-10-CM | POA: Insufficient documentation

## 2022-07-21 DIAGNOSIS — J101 Influenza due to other identified influenza virus with other respiratory manifestations: Secondary | ICD-10-CM | POA: Insufficient documentation

## 2022-07-21 DIAGNOSIS — Z1152 Encounter for screening for COVID-19: Secondary | ICD-10-CM | POA: Insufficient documentation

## 2022-07-21 LAB — COMPREHENSIVE METABOLIC PANEL, NON-FASTING
ALBUMIN/GLOBULIN RATIO: 0.9 (ref 0.8–1.4)
ALBUMIN: 3.7 g/dL (ref 3.5–5.7)
ALKALINE PHOSPHATASE: 165 U/L — ABNORMAL HIGH (ref 34–104)
ALT (SGPT): 59 U/L — ABNORMAL HIGH (ref 7–52)
ANION GAP: 8 mmol/L (ref 4–13)
AST (SGOT): 30 U/L (ref 13–39)
BILIRUBIN TOTAL: 0.7 mg/dL (ref 0.3–1.2)
BUN/CREA RATIO: 13 (ref 6–22)
BUN: 9 mg/dL (ref 7–25)
CALCIUM, CORRECTED: 9.1 mg/dL (ref 8.9–10.8)
CALCIUM: 8.9 mg/dL (ref 8.6–10.3)
CHLORIDE: 102 mmol/L (ref 98–107)
CO2 TOTAL: 27 mmol/L (ref 21–31)
CREATININE: 0.72 mg/dL (ref 0.60–1.30)
ESTIMATED GFR: 99 mL/min/{1.73_m2} (ref >59)
GLOBULIN: 4 (ref 2.9–5.4)
GLUCOSE: 92 mg/dL (ref 74–109)
OSMOLALITY, CALCULATED: 272 mOsm/kg (ref 270–290)
POTASSIUM: 3.4 mmol/L — ABNORMAL LOW (ref 3.5–5.1)
PROTEIN TOTAL: 7.7 g/dL (ref 6.4–8.9)
SODIUM: 137 mmol/L (ref 136–145)

## 2022-07-21 LAB — CBC WITH DIFF
BASOPHIL #: 0 10*3/uL (ref 0.00–0.10)
BASOPHIL %: 0 % (ref 0–1)
EOSINOPHIL #: 0.1 10*3/uL (ref 0.00–0.50)
EOSINOPHIL %: 1 %
HCT: 36.1 % (ref 31.2–41.9)
HGB: 12.1 g/dL (ref 10.9–14.3)
LYMPHOCYTE #: 2.3 10*3/uL (ref 1.00–3.00)
LYMPHOCYTE %: 25 % (ref 16–44)
MCH: 33.1 pg — ABNORMAL HIGH (ref 24.7–32.8)
MCHC: 33.4 g/dL (ref 32.3–35.6)
MCV: 99.2 fL — ABNORMAL HIGH (ref 75.5–95.3)
MONOCYTE #: 0.8 10*3/uL (ref 0.30–1.00)
MONOCYTE %: 9 % (ref 5–13)
MPV: 9.1 fL (ref 7.9–10.8)
NEUTROPHIL #: 6.1 10*3/uL (ref 1.85–7.80)
NEUTROPHIL %: 66 % (ref 43–77)
PLATELETS: 238 10*3/uL (ref 140–440)
RBC: 3.64 10*6/uL (ref 3.63–4.92)
RDW: 13.9 % (ref 12.3–17.7)
WBC: 9.2 10*3/uL (ref 3.8–11.8)

## 2022-07-21 LAB — COVID-19, FLU A/B, RSV RAPID BY PCR
INFLUENZA VIRUS TYPE A: DETECTED — AB
INFLUENZA VIRUS TYPE B: NOT DETECTED
RESPIRATORY SYNCTIAL VIRUS (RSV): NOT DETECTED
SARS-CoV-2: NOT DETECTED

## 2022-07-21 LAB — BLUE TOP TUBE

## 2022-07-21 MED ORDER — ALBUTEROL SULFATE 2.5 MG/3 ML (0.083 %) SOLUTION FOR NEBULIZATION
2.5000 mg | INHALATION_SOLUTION | Freq: Four times a day (QID) | RESPIRATORY_TRACT | 0 refills | Status: AC | PRN
Start: 2022-07-21 — End: 2022-07-28

## 2022-07-21 MED ORDER — POTASSIUM CHLORIDE ER 20 MEQ TABLET,EXTENDED RELEASE(PART/CRYST)
40.0000 meq | ORAL_TABLET | Freq: Once | ORAL | Status: AC
Start: 2022-07-21 — End: 2022-07-21
  Administered 2022-07-21: 40 meq via ORAL

## 2022-07-21 MED ORDER — PREDNISONE 20 MG TABLET
20.0000 mg | ORAL_TABLET | Freq: Every day | ORAL | 0 refills | Status: AC
Start: 2022-07-21 — End: 2022-07-26

## 2022-07-21 MED ORDER — IBUPROFEN 600 MG TABLET
600.0000 mg | ORAL_TABLET | Freq: Three times a day (TID) | ORAL | 0 refills | Status: AC
Start: 2022-07-21 — End: 2022-07-31

## 2022-07-21 MED ORDER — IPRATROPIUM 0.5 MG-ALBUTEROL 3 MG (2.5 MG BASE)/3 ML NEBULIZATION SOLN
3.0000 mL | INHALATION_SOLUTION | RESPIRATORY_TRACT | Status: AC
Start: 2022-07-21 — End: 2022-07-21
  Administered 2022-07-21: 3 mL via RESPIRATORY_TRACT

## 2022-07-21 MED ORDER — SODIUM CHLORIDE 0.9 % IV BOLUS
1000.0000 mL | INJECTION | Status: AC
Start: 2022-07-21 — End: 2022-07-21
  Administered 2022-07-21: 0 mL via INTRAVENOUS
  Administered 2022-07-21: 1000 mL via INTRAVENOUS

## 2022-07-21 MED ORDER — ONDANSETRON 4 MG DISINTEGRATING TABLET
8.0000 mg | ORAL_TABLET | Freq: Three times a day (TID) | ORAL | 0 refills | Status: AC | PRN
Start: 2022-07-21 — End: 2022-07-26

## 2022-07-21 MED ORDER — POTASSIUM CHLORIDE ER 20 MEQ TABLET,EXTENDED RELEASE(PART/CRYST)
ORAL_TABLET | ORAL | Status: AC
Start: 2022-07-21 — End: 2022-07-21
  Filled 2022-07-21: qty 2

## 2022-07-21 NOTE — ED Provider Notes (Signed)
[facility]  ED Primary Provider Note  History of Present Illness   Chief Complaint   Patient presents with    Flu Like Symptoms     Ashley Moreno is a 57 y.o. female who had concerns including Flu Like Symptoms.  Arrival: The patient arrived by Car    Patient states that she was diagnosed with the flu last Tuesday, 9 days ago.  She states that her symptoms have not improved since that time she returned to the ER on Sunday for the same symptoms.  She was seen her PCP today and sent to the emergency department's due to continued flu symptoms.  Her symptoms include chills decreased appetite and coughing.        History Reviewed This Encounter: Medical History  Surgical History  Family History  Social History    Physical Exam   ED Triage Vitals [07/21/22 1654]   BP (Non-Invasive) (!) 144/85   Heart Rate 85   Respiratory Rate 18   Temperature 36.3 C (97.4 F)   SpO2 97 %   Weight 81.6 kg (180 lb)   Height      Physical Exam  Vitals and nursing note reviewed.   Constitutional:       General: She is not in acute distress.     Appearance: She is well-developed.   HENT:      Head: Normocephalic and atraumatic.   Eyes:      Conjunctiva/sclera: Conjunctivae normal.   Cardiovascular:      Rate and Rhythm: Normal rate and regular rhythm.      Heart sounds: No murmur heard.  Pulmonary:      Effort: Pulmonary effort is normal. No respiratory distress.      Breath sounds: Wheezing (Expiratory wheezing bilaterally) present.   Abdominal:      Palpations: Abdomen is soft.      Tenderness: There is no abdominal tenderness.   Musculoskeletal:         General: No swelling.      Cervical back: Neck supple.   Skin:     General: Skin is warm and dry.      Capillary Refill: Capillary refill takes less than 2 seconds.   Neurological:      Mental Status: She is alert.   Psychiatric:         Mood and Affect: Mood normal.       Patient Data     Labs Ordered/Reviewed   COMPREHENSIVE METABOLIC PANEL,  NON-FASTING - Abnormal; Notable for the following components:       Result Value    POTASSIUM 3.4 (*)     ALKALINE PHOSPHATASE 165 (*)     ALT (SGPT) 59 (*)     All other components within normal limits    Narrative:     Estimated Glomerular Filtration Rate (eGFR) is calculated using the CKD-EPI (2021) equation, intended for patients 54 years of age and older. If gender is not documented or "unknown", there will be no eGFR calculation.     COVID-19, FLU A/B, RSV RAPID BY PCR - Abnormal; Notable for the following components:    INFLUENZA VIRUS TYPE A Detected (*)     All other components within normal limits    Narrative:     Results are for the simultaneous qualitative identification of SARS-CoV-2 (formerly 2019-nCoV), Influenza A, Influenza B, and RSV RNA. These etiologic agents are generally detectable in nasopharyngeal and nasal swabs during the ACUTE PHASE of infection.  Hence, this test is intended to be performed on respiratory specimens collected from individuals with signs and symptoms of upper respiratory tract infection who meet Centers for Disease Control and Prevention (CDC) clinical and/or epidemiological criteria for Coronavirus Disease 2019 (COVID-19) testing. CDC COVID-19 criteria for testing on human specimens is available at [facility] webpage information for Healthcare Professionals: Coronavirus Disease 2019 (COVID-19) ([email or web address]).     False-negative results may occur if the virus has genomic mutations, insertions, deletions, or rearrangements or if performed very early in the course of illness. Otherwise, negative results indicate virus specific RNA targets are not detected, however negative results do not preclude SARS-CoV-2 infection/COVID-19, Influenza, or Respiratory syncytial virus infection. Results should not be used as the sole basis for patient management decisions. Negative results must be combined with clinical observations, patient history, and  epidemiological information. If upper respiratory tract infection is still suspected based on exposure history together with other clinical findings, re-testing should be considered.    Disclaimer:   This assay has been authorized by FDA under an Emergency Use Authorization for use in laboratories certified under the Clinical Laboratory Improvement Amendments of 1988 (CLIA), 42 U.S.C. 9145227128, to perform high complexity tests. The impacts of vaccines, antiviral therapeutics, antibiotics, chemotherapeutic or immunosuppressant drugs have not been evaluated.     Test methodology:   Cepheid Xpert Xpress SARS-CoV-2/Flu/RSV Assay real-time polymerase chain reaction (RT-PCR) test on the GeneXpert Dx and Xpert Xpress systems.   CBC WITH DIFF - Abnormal; Notable for the following components:    MCV 99.2 (*)     MCH 33.1 (*)     All other components within normal limits   CBC/DIFF    Narrative:     The following orders were created for panel order CBC/DIFF.  Procedure                               Abnormality         Status                     ---------                               -----------         ------                     CBC WITH DIFF[[id number]]                Abnormal            Final result                 Please view results for these tests on the individual orders.   EXTRA TUBES    Narrative:     The following orders were created for panel order EXTRA TUBES.  Procedure                               Abnormality         Status                     ---------                               -----------         ------  BLUE TOP [id number]                                    Final result                 Please view results for these tests on the individual orders.   BLUE TOP TUBE     XR CHEST PA AND LATERAL   Final Result by Edi, Radresults In (02/01 1757)   No active infiltrate         Radiologist location ID: [place] [facility]        Medical Decision Making  Patient  presents ambulatory to the ED with complaints of flu symptoms.  She was diagnosed with the flu 9 days ago and continues to be symptomatic per the patient.  She complaints of chills decreased appetite and coughing.  Differential diagnosis includes but is not limited to COVID, RSV, flu, pneumonia, bronchitis.  Labs in ED. potassium 3.4 patient given 40 mEq of potassium p.o..  Patient received nebulizer treatment in ED with significant improvement of wheezing.  She continues to be positive for the flu.  Chest x-ray was negative for any acute finding.  Patient given prescription for Zofran and ibuprofen p.r.n., prednisone as directed given a prescription for nebulizer machine with albuterol for home use.  After 1 L of normal saline patient discharged to home.  After patient received L of fluid, she stated that she was ready to be discharged, and she felt better.  Patient is to return to the ED if symptoms do not improve or worsen, and is to follow up with her PCP    Risk  Prescription drug management.             Medications Administered in the ED   NS bolus infusion 1,000 mL (has no administration in time range)   ipratropium-albuterol 0.5 mg-3 mg(2.5 mg base)/3 mL Solution for Nebulization (3 mL Nebulization Given 07/21/22 2002)   potassium chloride (K-DUR) extended release tablet (40 mEq Oral Given 07/21/22 2002)     Clinical Impression   Influenza A (Primary)   Cough, unspecified type   Bronchitis       Disposition: Discharged

## 2022-07-21 NOTE — ED Nurses Note (Signed)
Pt was given discharge paperwork.  IV was taken out and intact.  Pt stated she has not taken her BP meds for today.  Pt left department with family.

## 2022-07-21 NOTE — ED Triage Notes (Signed)
DX WITH FLU ON SUNDAY. SEEN BY PCP TODAY AND SENT TO ED FOR CONTINUED SX OF FLU.

## 2022-07-21 NOTE — ED APP Handoff Note (Signed)
[facility]  Emergency Department  Provider in Triage Note    Name: Ashley Moreno  Age: 58 y.o.  Gender: female     Subjective:   Ashley Moreno is a 57 y.o. female who presents with complaint of Flu Like Symptoms  .  She was diagnosed with flu last week symptoms not improving.  She did not take Tamiflu.  She was here on Sunday discharged on [facility].  She continues to complain of shortness of breath.  No history of asthma.  New onset altered smelling and taste.  Objective:   Filed Vitals:    07/21/22 1654   BP: (!) 144/85   Pulse: 85   Resp: 18   Temp: 36.3 C (97.4 F)   SpO2: 97%      Focused Physical Exam shows alert and oriented.  Faint expiratory wheezes bilaterally.  Dry nonproductive cough.    Assessment:  A medical screening exam was completed.  This patient is a 57 y.o. female with initial findings showing flu-like symptoms    Plan:  Please see initial orders and work-up below.  This is to be continued with full evaluation in the main Emergency Department.     No current facility-administered medications for this encounter.     Results for orders placed or performed during the hospital encounter of 07/21/22 (from the past 24 hour(s))   CBC/DIFF    Narrative    The following orders were created for panel order CBC/DIFF.  Procedure                               Abnormality         Status                     ---------                               -----------         ------                     CBC WITH DIFF[[id number]]                                                                 Please view results for these tests on the individual orders.        Boris Lown, APRN  07/21/2022, 16:54

## 2022-07-21 NOTE — ED Nurses Note (Signed)
Patient states dc'd with flu last Tuesday (1/23) states she just cannot get better. Continues to have cough and body aches. States seen in ER on Sunday and given tesslon pearls without relief. Denies any other c/o. Resp even and unlabored.

## 2022-07-21 NOTE — Discharge Instructions (Addendum)
Ibuprofen and Zofran as directed p.r.n.Marland Kitchen   prednisone as directed.  Order for at home nebulizer machine with albuterol, use as directed p.r.n.  Increase clear fluids . if symptoms do not resolve or worsen return to the ED.    Advise smoking cessation.

## 2024-04-29 ENCOUNTER — Other Ambulatory Visit (HOSPITAL_COMMUNITY): Payer: Self-pay | Admitting: PHYSICIAN ASSISTANT

## 2024-04-29 DIAGNOSIS — Z1231 Encounter for screening mammogram for malignant neoplasm of breast: Secondary | ICD-10-CM

## 2024-05-08 ENCOUNTER — Other Ambulatory Visit: Payer: Self-pay

## 2024-05-08 ENCOUNTER — Ambulatory Visit
Admission: RE | Admit: 2024-05-08 | Discharge: 2024-05-08 | Disposition: A | Discharge status: Home / Self Care | Payer: Self-pay | Source: Ambulatory Visit | Attending: PHYSICIAN ASSISTANT | Admitting: PHYSICIAN ASSISTANT

## 2024-05-08 ENCOUNTER — Encounter (HOSPITAL_BASED_OUTPATIENT_CLINIC_OR_DEPARTMENT_OTHER): Payer: Self-pay

## 2024-05-08 DIAGNOSIS — Z1231 Encounter for screening mammogram for malignant neoplasm of breast: Secondary | ICD-10-CM | POA: Insufficient documentation
